# Patient Record
Sex: Female | Born: 1990
Health system: Southern US, Community
[De-identification: ages and names within clinical notes are randomized; demographics above are authoritative.]

## PROBLEM LIST (undated history)

## (undated) DIAGNOSIS — B9689 Other specified bacterial agents as the cause of diseases classified elsewhere: Secondary | ICD-10-CM

## (undated) DIAGNOSIS — Z9189 Other specified personal risk factors, not elsewhere classified: Secondary | ICD-10-CM

## (undated) DIAGNOSIS — K219 Gastro-esophageal reflux disease without esophagitis: Secondary | ICD-10-CM

## (undated) DIAGNOSIS — N760 Acute vaginitis: Secondary | ICD-10-CM

## (undated) DIAGNOSIS — Z803 Family history of malignant neoplasm of breast: Secondary | ICD-10-CM

## (undated) DIAGNOSIS — Z1371 Encounter for nonprocreative screening for genetic disease carrier status: Secondary | ICD-10-CM

## (undated) DIAGNOSIS — A549 Gonococcal infection, unspecified: Secondary | ICD-10-CM

## (undated) DIAGNOSIS — N390 Urinary tract infection, site not specified: Secondary | ICD-10-CM

## (undated) HISTORY — DX: Other specified personal risk factors, not elsewhere classified: Z91.89

## (undated) HISTORY — DX: Gonococcal infection, unspecified: A54.9

## (undated) HISTORY — DX: Family history of malignant neoplasm of breast: Z80.3

## (undated) HISTORY — DX: Other specified bacterial agents as the cause of diseases classified elsewhere: B96.89

## (undated) HISTORY — DX: Acute vaginitis: N76.0

## (undated) HISTORY — DX: Encounter for nonprocreative screening for genetic disease carrier status: Z13.71

---
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
## 2008-06-20 ENCOUNTER — Emergency Department: Payer: Self-pay | Admitting: Emergency Medicine

## 2008-06-28 ENCOUNTER — Emergency Department: Payer: Self-pay | Admitting: Emergency Medicine

## 2009-04-06 ENCOUNTER — Emergency Department: Payer: Self-pay | Admitting: Emergency Medicine

## 2012-01-30 DIAGNOSIS — M549 Dorsalgia, unspecified: Secondary | ICD-10-CM | POA: Insufficient documentation

## 2012-04-05 ENCOUNTER — Emergency Department: Payer: Self-pay | Admitting: Emergency Medicine

## 2013-11-05 ENCOUNTER — Ambulatory Visit: Payer: Self-pay | Admitting: Internal Medicine

## 2013-11-05 LAB — URINALYSIS, COMPLETE
BILIRUBIN, UR: NEGATIVE
Glucose,UR: NEGATIVE mg/dL (ref 0–75)
Ketone: NEGATIVE
NITRITE: NEGATIVE
Ph: 7 (ref 4.5–8.0)
SPECIFIC GRAVITY: 1.015 (ref 1.003–1.030)

## 2013-11-07 LAB — URINE CULTURE

## 2014-06-11 ENCOUNTER — Ambulatory Visit: Payer: Self-pay | Admitting: Physician Assistant

## 2014-10-14 ENCOUNTER — Ambulatory Visit
Admission: EM | Admit: 2014-10-14 | Discharge: 2014-10-14 | Disposition: A | Discharge status: Home / Self Care | Payer: 59 | Attending: Family Medicine | Admitting: Family Medicine

## 2014-10-14 ENCOUNTER — Encounter: Payer: Self-pay | Admitting: Emergency Medicine

## 2014-10-14 DIAGNOSIS — N3090 Cystitis, unspecified without hematuria: Secondary | ICD-10-CM | POA: Diagnosis present

## 2014-10-14 DIAGNOSIS — Z79899 Other long term (current) drug therapy: Secondary | ICD-10-CM | POA: Diagnosis not present

## 2014-10-14 DIAGNOSIS — L309 Dermatitis, unspecified: Secondary | ICD-10-CM | POA: Insufficient documentation

## 2014-10-14 DIAGNOSIS — N390 Urinary tract infection, site not specified: Secondary | ICD-10-CM | POA: Insufficient documentation

## 2014-10-14 HISTORY — DX: Urinary tract infection, site not specified: N39.0

## 2014-10-14 LAB — PREGNANCY, URINE: Preg Test, Ur: NEGATIVE

## 2014-10-14 LAB — URINALYSIS COMPLETE WITH MICROSCOPIC (ARMC ONLY)
BILIRUBIN URINE: NEGATIVE
Glucose, UA: NEGATIVE mg/dL
Ketones, ur: NEGATIVE mg/dL
NITRITE: NEGATIVE
Specific Gravity, Urine: 1.01 (ref 1.005–1.030)
pH: 5.5 (ref 5.0–8.0)

## 2014-10-14 MED ORDER — CIPROFLOXACIN HCL 500 MG PO TABS
500.0000 mg | ORAL_TABLET | Freq: Two times a day (BID) | ORAL | Status: DC
Start: 2014-10-14 — End: 2015-11-07

## 2014-10-14 MED ORDER — TRIAMCINOLONE ACETONIDE 0.1 % EX CREA: 1.0000 "application " | TOPICAL_CREAM | Freq: Two times a day (BID) | CUTANEOUS | Status: DC

## 2014-10-14 MED ORDER — PHENAZOPYRIDINE HCL 200 MG PO TABS: 200.0000 mg | ORAL_TABLET | Freq: Three times a day (TID) | ORAL | Status: DC

## 2014-10-14 MED ORDER — CARRINGTON MOISTURE BARRIER EX CREA: TOPICAL_CREAM | CUTANEOUS | Status: DC | PRN

## 2014-10-14 NOTE — ED Provider Notes (Signed)
CSN: [id number]     Arrival date & time 10/14/14  1103 History   First MD Initiated Contact with Patient 10/14/14 1017     Chief Complaint  Patient presents with  . Cystitis   (Consider location/radiation/quality/duration/timing/severity/associated sxs/prior Treatment) HPI Comments: Pt also c/o itchy chronic rash to bilateral arms, especially skin folds and near tattoos. Denies new lotions, perfumes, soaps, detergents, etc.   Patient is a 24 y.o. female presenting with dysuria. The history is provided by the patient. No language interpreter was used.  Dysuria Pain quality:  Burning Pain severity:  Moderate Onset quality:  Sudden Timing:  Constant Progression:  Unchanged Chronicity:  Recurrent Recent urinary tract infections: yes   Relieved by:  Nothing Worsened by:  Nothing tried Urinary symptoms: foul-smelling urine and hesitancy   Associated symptoms: abdominal pain   Associated symptoms: no fever, no flank pain, no genital lesions, no nausea, no vaginal discharge and no vomiting   Associated symptoms comment:  Denies vaginal discharge or new partner, no fever, no N/V.  Risk factors: not pregnant     Past Medical History  Diagnosis Date  . UTI (lower urinary tract infection)    History reviewed. No pertinent past surgical history. Family History  Problem Relation Age of Onset  . Schizophrenia Mother   . Autism Brother    History  Substance Use Topics  . Smoking status: Never Smoker   . Smokeless tobacco: Not on file  . Alcohol Use: Yes     Comment: socially   OB History    No data available     Review of Systems  Constitutional: Negative for fever, chills and appetite change.  HENT: Negative.   Eyes: Negative.   Respiratory: Negative for shortness of breath.   Cardiovascular: Negative for chest pain.  Gastrointestinal: Positive for abdominal pain. Negative for nausea, vomiting, diarrhea and constipation.  Endocrine: Negative.   Genitourinary: Positive for  dysuria. Negative for hematuria, flank pain, vaginal bleeding and vaginal discharge.  Musculoskeletal: Positive for back pain.  Skin: Negative for rash.  Allergic/Immunologic: Negative.   Neurological: Negative.   Hematological: Negative.   Psychiatric/Behavioral: Negative.   All other systems reviewed and are negative.   Allergies  Review of patient's allergies indicates no known allergies.  Home Medications   Prior to Admission medications   Medication Sig Start Date End Date Taking? Authorizing Provider  etonogestrel (NEXPLANON) 68 MG IMPL implant 1 each by Subdermal route once.   Yes Historical Provider, MD  ciprofloxacin (CIPRO) 500 MG tablet Take 1 tablet (500 mg total) by mouth 2 (two) times daily. 10/14/14   Clancy Gourd, NP  phenazopyridine (PYRIDIUM) 200 MG tablet Take 1 tablet (200 mg total) by mouth 3 (three) times daily. 10/14/14   Clancy Gourd, NP  Skin Protectants, Misc. (EUCERIN) cream Apply topically as needed for wound care. To moisturize 10/14/14   Clancy Gourd, NP  triamcinolone cream (KENALOG) 0.1 % Apply 1 application topically 2 (two) times daily. Sparingly to areas that itch 10/14/14   Jennetta Flood, NP   BP 110/66 mmHg  Pulse 72  Temp(Src) 98.5 F (36.9 C) (Oral)  Resp 16  Ht 5\' 3"  (1.6 m)  Wt 145 lb (65.772 kg)  BMI 25.69 kg/m2  SpO2 100%  LMP 09/27/2014 (Exact Date) Physical Exam  Constitutional: She is oriented to person, place, and time. Vital signs are normal. She appears well-developed and well-nourished. She is active and cooperative. No distress.  HENT:  Head: Normocephalic.  Eyes: Pupils are equal,  round, and reactive to light.  Neck: Normal range of motion.  Cardiovascular: Normal rate, regular rhythm, normal heart sounds and normal pulses.   Pulmonary/Chest: Effort normal and breath sounds normal.  Abdominal: Normal appearance and bowel sounds are normal. There is tenderness in the suprapubic area. There is no rebound, no  guarding and no CVA tenderness.  Musculoskeletal: Normal range of motion.  Neurological: She is alert and oriented to person, place, and time. No cranial nerve deficit or sensory deficit. GCS eye subscore is 4. GCS verbal subscore is 5. GCS motor subscore is 6.  Skin: Skin is warm and dry. Rash noted. Rash is macular.  Psychiatric: She has a normal mood and affect. Her speech is normal and behavior is normal.  Nursing note and vitals reviewed.   ED Course  Procedures (including critical care time) Labs Review Labs Reviewed  URINALYSIS COMPLETEWITH MICROSCOPIC ([facility] ONLY) - Abnormal; Notable for the following:    APPearance HAZY (*)    Hgb urine dipstick 2+ (*)    Protein, ur PRESENT (*)    Leukocytes, UA 3+ (*)    Squamous Epithelial / LPF 0-5 (*)    All other components within normal limits  URINE CULTURE  PREGNANCY, URINE   Results for orders placed or performed during the hospital encounter of 10/14/14  Urinalysis complete, with microscopic  Result Value Ref Range   Color, Urine YELLOW YELLOW   APPearance HAZY (A) CLEAR   Glucose, UA NEGATIVE NEGATIVE mg/dL   Bilirubin Urine NEGATIVE NEGATIVE   Ketones, ur NEGATIVE NEGATIVE mg/dL   Specific Gravity, Urine 1.010 1.005 - 1.030   Hgb urine dipstick 2+ (A) NEGATIVE   pH 5.5 5.0 - 8.0   Protein, ur PRESENT (A) NEGATIVE mg/dL   Nitrite NEGATIVE NEGATIVE   Leukocytes, UA 3+ (A) NEGATIVE   RBC / HPF 0-5 <3 RBC/hpf   WBC, UA TOO NUMEROUS TO COUNT <3 WBC/hpf   Bacteria, UA RARE RARE   Squamous Epithelial / LPF 0-5 (A) RARE   WBC Clumps PRESENT   Pregnancy, urine  Result Value Ref Range   Preg Test, Ur NEGATIVE NEGATIVE   Imaging Review No results found.   MDM   1. UTI (lower urinary tract infection)   2. Eczema    Discussed plan of care with pt: You are being treated for UTI and eczema. Moisture your skin. Recommend f/u with dermatology if rash persists or worsens. Avoid heat/hot water as it makes rashes worse.  Rest,push fluids, take meds as directed (Cipro/pyridium, triamcinolone cream/eucerin). Follow up with PCP, in 2 days for recheck. Also referral given to local Urologist, for further eval of recurrent UTI's. Return as needed. Go to Er if develops fever, NV,V, unable to take antibiotics. Pt verbalized understanding to this provider.   Clancy Gourd, NP 10/14/14 [phone]

## 2014-10-14 NOTE — Discharge Instructions (Signed)
You are being treated for UTI and eczema. Moisture your skin. Recommend f/u with dermatology if rash persists or worsens. Avoid heat/hot water as it makes rashes worse. Rest,push fluids, take meds as directed(Cipro/pyridium). Follow up with PCP, in 2 days for recheck. Also referral given to local Urologist, for further eval of recurrent UTI's. Return as needed. Go to Er if develops fever, NV,V, unable to take antibiotics.

## 2014-10-14 NOTE — ED Notes (Signed)
Started Monday morning with foul odor to urine, tingling/burning at the end of voiding.  C/o suprapubic and low back pain .  Denies fever

## 2014-10-16 LAB — URINE CULTURE

## 2015-03-17 ENCOUNTER — Encounter: Payer: Self-pay | Admitting: Emergency Medicine

## 2015-03-17 ENCOUNTER — Ambulatory Visit
Admission: EM | Admit: 2015-03-17 | Discharge: 2015-03-17 | Disposition: A | Discharge status: Home / Self Care | Payer: 59 | Attending: Family Medicine | Admitting: Family Medicine

## 2015-03-17 DIAGNOSIS — N390 Urinary tract infection, site not specified: Secondary | ICD-10-CM

## 2015-03-17 LAB — URINALYSIS COMPLETE WITH MICROSCOPIC (ARMC ONLY)
Bilirubin Urine: NEGATIVE
Glucose, UA: NEGATIVE mg/dL
Ketones, ur: NEGATIVE mg/dL
Nitrite: POSITIVE — AB
PROTEIN: NEGATIVE mg/dL
SPECIFIC GRAVITY, URINE: 1.01 (ref 1.005–1.030)
pH: 7 (ref 5.0–8.0)

## 2015-03-17 LAB — PREGNANCY, URINE: Preg Test, Ur: NEGATIVE

## 2015-03-17 MED ORDER — NITROFURANTOIN MONOHYD MACRO 100 MG PO CAPS: 100.0000 mg | ORAL_CAPSULE | Freq: Two times a day (BID) | ORAL | Status: DC

## 2015-03-17 NOTE — ED Notes (Signed)
Patient c/o burning when urinating since yesterday.  Patient denies N/V.  Patient denies fevers.

## 2015-03-17 NOTE — Discharge Instructions (Signed)
Take medication as prescribed. Follow up with your primary care physician as discussed. Return to Urgent care as needed for new or worsening concerns.   Urinary Tract Infection A urinary tract infection (UTI) can occur any place along the urinary tract. The tract includes the kidneys, ureters, bladder, and urethra. A type of germ called bacteria often causes a UTI. UTIs are often helped with antibiotic medicine.  HOME CARE   If given, take antibiotics as told by your doctor. Finish them even if you start to feel better.  Drink enough fluids to keep your pee (urine) clear or pale yellow.  Avoid tea, drinks with caffeine, and bubbly (carbonated) drinks.  Pee often. Avoid holding your pee in for a long time.  Pee before and after having sex (intercourse).  Wipe from front to back after you poop (bowel movement) if you are a woman. Use each tissue only once. GET HELP RIGHT AWAY IF:   You have back pain.  You have lower belly (abdominal) pain.  You have chills.  You feel sick to your stomach (nauseous).  You throw up (vomit).  Your burning or discomfort with peeing does not go away.  You have a fever.  Your symptoms are not better in 3 days. MAKE SURE YOU:   Understand these instructions.  Will watch your condition.  Will get help right away if you are not doing well or get worse.   This information is not intended to replace advice given to you by your health care provider. Make sure you discuss any questions you have with your health care provider.   Document Released: 09/27/2007 Document Revised: 05/01/2014 Document Reviewed: 11/09/2011 Elsevier Interactive Patient Education [employer]2016 Elsevier Inc.

## 2015-03-17 NOTE — ED Provider Notes (Signed)
[facility]  ____________________________________________  Time seen: Approximately 5:12 PM  I have reviewed the triage vital signs and the nursing notes.   HISTORY   Chief Complaint Dysuria  HPI Traci Flowers is a 24 y.o. female presents for the complaints of 1 day of burning with urination as well as frequent urination. Patient also reports that her urine smells stronger. Patient reports she has a history of urinary tract infections and this feels similar. States last urinary tract infection was approximate 4 months ago. Denies recent antibiotic use. Denies current pain. Reports occasional suprapubic pressure pain described as mild.  Denies recent changes in sexual partners. Denies vaginal complaints. Denies vaginal discharge, vaginal pain, vaginal odor. Denies back pain. Denies fever, nausea, vomiting, diarrhea. Reports continues to eat and drink well.  [facility] PCP: Edwyna ShellBurney  LMP: one week   Past Medical History  Diagnosis Date  . UTI (lower urinary tract infection)     There are no active problems to display for this patient.   History reviewed. No pertinent past surgical history.  Current Outpatient Rx  Name  Route  Sig  Dispense  Refill  .           . etonogestrel (NEXPLANON) 68 MG IMPL implant   Subdermal   1 each by Subdermal route once.         .           .           .             Allergies Review of patient's allergies indicates no known allergies.  Family History  Problem Relation Age of Onset  . Schizophrenia Mother   . Autism Brother     Social History Social History  Substance Use Topics  . Smoking status: Never Smoker   . Smokeless tobacco: None  . Alcohol Use: Yes     Comment: socially    Review of Systems Constitutional: No fever/chills Eyes: No visual changes. ENT: No sore throat. Cardiovascular: Denies chest pain. Respiratory: Denies shortness of breath. Gastrointestinal: No abdominal pain.  No nausea,  no vomiting.  No diarrhea.  No constipation. Genitourinary: positive for dysuria. Musculoskeletal: Negative for back pain. Skin: Negative for rash. Neurological: Negative for headaches, focal weakness or numbness.  10-point ROS otherwise negative.  ____________________________________________   PHYSICAL EXAM:  VITAL SIGNS: ED Triage Vitals  Enc Vitals Group     BP 03/17/15 1637 108/65 mmHg     Pulse Rate 03/17/15 1637 60     Resp 03/17/15 1637 16     Temp 03/17/15 1637 97.8 F (36.6 C)     Temp Source 03/17/15 1637 Tympanic     SpO2 03/17/15 1637 100 %     Weight 03/17/15 1637 148 lb (67.132 kg)     Height 03/17/15 1637 5\' 3"  (1.6 m)     Head Cir --      Peak Flow --      Pain Score --      Pain Loc --      Pain Edu? --      Excl. in GC? --     Constitutional: Alert and oriented. Well appearing and in no acute distress. Eyes: Conjunctivae are normal. PERRL. EOMI. Head: Atraumatic. Nontender.    Nose: No congestion/rhinnorhea.  Mouth/Throat: Mucous membranes are moist.  Oropharynx non-erythematous. Neck: No stridor.  No cervical spine tenderness to palpation. Hematological/Lymphatic/Immunilogical: No cervical lymphadenopathy. Cardiovascular: Normal rate, regular rhythm. Grossly  normal heart sounds.  Good peripheral circulation. Respiratory: Normal respiratory effort.  No retractions. Lungs CTAB. Gastrointestinal: Soft and nontender. No distention. Normal Bowel sounds.  No CVA tenderness. Musculoskeletal: No lower or upper extremity tenderness nor edema. Bilateral pedal pulses equal and easily palpated.  Neurologic:  Normal speech and language. No gross focal neurologic deficits are appreciated. No gait instability. Skin:  Skin is warm, dry and intact. No rash noted. Psychiatric: Mood and affect are normal. Speech and behavior are normal .  ____________________________________________   LABS (all labs ordered are listed, but only abnormal results are  displayed)  Labs Reviewed  URINALYSIS COMPLETEWITH MICROSCOPIC ([facility] ONLY) - Abnormal; Notable for the following:    APPearance HAZY (*)    Hgb urine dipstick 2+ (*)    Nitrite POSITIVE (*)    Leukocytes, UA 3+ (*)    Bacteria, UA MANY (*)    Squamous Epithelial / LPF 0-5 (*)    All other components within normal limits  URINE CULTURE  PREGNANCY, URINE     INITIAL IMPRESSION / ASSESSMENT AND PLAN / ED COURSE  Pertinent labs & imaging results that were available during my care of the patient were reviewed by me and considered in my medical decision making (see chart for details).   Very well-appearing patient. No acute distress. Presents for the complaints of 1 day of dysuria. Will treat your tract infection with oral Macrobid. Patient follow-up with primary care physician as needed. Encouraged to void post sexual intercourse, increased water intake and follow-up as needed. States she does not need pyridium.   Discussed follow up with Primary care physician this week. Discussed follow up and return parameters including no resolution or any worsening concerns. Patient verbalized understanding and agreed to plan.   ____________________________________________   FINAL CLINICAL IMPRESSION(S) / ED DIAGNOSES  Final diagnoses:  UTI (lower urinary tract infection)       Renford Dills, NP 03/17/15 1735

## 2015-03-20 LAB — URINE CULTURE
Culture: >=100000
SPECIAL REQUESTS: NORMAL

## 2015-04-22 ENCOUNTER — Encounter: Payer: Self-pay | Admitting: *Deleted

## 2015-04-22 ENCOUNTER — Ambulatory Visit
Admission: EM | Admit: 2015-04-22 | Discharge: 2015-04-22 | Disposition: A | Discharge status: Home / Self Care | Payer: 59 | Attending: Family Medicine | Admitting: Family Medicine

## 2015-04-22 DIAGNOSIS — M436 Torticollis: Secondary | ICD-10-CM

## 2015-04-22 MED ORDER — KETOROLAC TROMETHAMINE 60 MG/2ML IM SOLN
60.0000 mg | Freq: Once | INTRAMUSCULAR | Status: AC
  Administered 2015-04-22: 60 mg via INTRAMUSCULAR

## 2015-04-22 MED ORDER — CYCLOBENZAPRINE HCL 10 MG PO TABS: 10.0000 mg | ORAL_TABLET | Freq: Three times a day (TID) | ORAL | Status: DC | PRN

## 2015-04-22 NOTE — ED Provider Notes (Signed)
[facility]  ____________________________________________  Time seen: Approximately 11:19 AM  I have reviewed the triage vital signs and the nursing notes.   HISTORY  Chief Complaint Neck Pain   HPI  Traci Flowers is a 24 y.o. female presents with complaints of right neck pain. 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 Patient states that "I think I slept wrong". Denies fall or direct trauma. Patient states that pain is primarily with movement and primarily with looking towards her right. Denies chronic neck pain.  States current neck pain is 1 out of 10 but states when looking to the right pain is 6 out of 10 and catching. Patient states the pain is tolerable however states as she was at work last night and having to move and reaching grab things her pain was intermittently increasing. Denies pain radiation. Denies numbness or tingling sensation. Denies midline pain. Denies other pain or injuries. Reports took some over-the-counter ibuprofen which helped some.   Past Medical History  Diagnosis Date  . UTI (lower urinary tract infection)     There are no active problems to display for this patient.   History reviewed. No pertinent past surgical history.  Current Outpatient Rx  Name  Route  Sig  Dispense  Refill  . etonogestrel (NEXPLANON) 68 MG IMPL implant   Subdermal   1 each by Subdermal route once.         .           .           .           .           .           .           Last menstrual 2 weeks ago.: Denies chance of pregnancy  Allergies Review of patient's allergies indicates no known allergies.  Family History  Problem Relation Age of Onset  . Schizophrenia Mother   . Autism Brother     Social History Social History  Substance Use Topics  . Smoking status: Never Smoker   . Smokeless tobacco: None  . Alcohol Use: Yes     Comment: socially     Review of Systems Constitutional: No fever/chills Eyes: No visual changes. ENT: No sore throat. Cardiovascular: Denies chest pain. Respiratory: Denies shortness of breath. Gastrointestinal: No abdominal pain.  No nausea, no vomiting.  No diarrhea.  No constipation. Genitourinary: Negative for dysuria. Musculoskeletal: Negative for back pain. Right neck pain. Skin: Negative for rash. Neurological: Negative for headaches, focal weakness or numbness.  10-point ROS otherwise negative.  ____________________________________________   PHYSICAL EXAM:  VITAL SIGNS: ED Triage Vitals  Enc Vitals Group     BP 04/22/15 1030 117/47 mmHg     Pulse Rate 04/22/15 1030 64     Resp 04/22/15 1030 20     Temp 04/22/15 1030 97.6 F (36.4 C)     Temp Source 04/22/15 1030 Oral     SpO2 04/22/15 1030 100 %     Weight 04/22/15 1030 150 lb (68.04 kg)     Height 04/22/15 1030  (1.6 m)     Head Cir --      Peak Flow --      Pain Score 04/22/15 1034 7     Pain Loc --  Pain Edu? --      Excl. in GC? --     Constitutional: Alert and oriented. Well appearing and in no acute distress. Eyes: Conjunctivae are normal. PERRL. EOMI. Head: Atraumatic.  Ears: no erythema, normal TMs bilaterally.   Nose: No congestion/rhinnorhea.  Mouth/Throat: Mucous membranes are moist.  Oropharynx non-erythematous. Neck: No stridor.  No cervical spine tenderness to palpation. Hematological/Lymphatic/Immunilogical: No cervical lymphadenopathy. Cardiovascular: Normal rate, regular rhythm. Grossly normal heart sounds.  Good peripheral circulation. Respiratory: Normal respiratory effort.  No retractions. Lungs CTAB. Gastrointestinal: Soft and nontender.  Musculoskeletal: No lower or upper extremity tenderness nor edema. No midline cervical, thoracic or lumbar tenderness to palpation. Patient with point tenderness to right trapezius, with palpable muscle spasm with right flexion. No pain with cervical flexion  or extension. Mild pain with left cervical rotation, mild to moderate pain with right cervical rotation, again no midline cervical tenderness. No swelling. No erythema. Bilateral upper x-rays nontender with 5 out of 5 strength. Bilateral hand grips equal, sensation intact bilateral upper extremities, bilateral distal radial pulses equal.  Neurologic:  Normal speech and language. No gross focal neurologic deficits are appreciated. No gait instability. Skin:  Skin is warm, dry and intact. No rash noted. Psychiatric: Mood and affect are normal. Speech and behavior are normal.  ____________________________________________   LABS (all labs ordered are listed, but only abnormal results are displayed)  Labs Reviewed - No data to display   INITIAL IMPRESSION / ASSESSMENT AND PLAN / ED COURSE  Pertinent labs & imaging results that were available during my care of the patient were reviewed by me and considered in my medical decision making (see chart for details).  Very well-appearing patient. No acute distress. Presents for the complaint of right neck pain since last night after waking up from sleep. Denies fall or trauma. No midline tenderness. Point reproducible pain at right trapezius with palpable muscle spasm. Suspect muscular strain. Will treat with IM Toradol 60 mg 1 in urgent care. Will also treat supportively with alternate between heat and ice, stretch, rest. Patient states that she will take over-the-counter ibuprofen and also will treat when necessary with Flexeril.  Discussed follow up with Primary care physician this week. Discussed follow up and return parameters including no resolution or any worsening concerns. Patient verbalized understanding and agreed to plan.   ____________________________________________   FINAL CLINICAL IMPRESSION(S) / ED DIAGNOSES  Final diagnoses:  Right torticollis       Renford DillsLindsey Jerald Hennington, NP 04/22/15 1126

## 2015-04-22 NOTE — Discharge Instructions (Signed)
Take medication as prescribed. Alternate heat and ice. Stretch.   Follow up with your primary care physician this week as needed. Return to Urgent care for new or worsening concerns.   Acute Torticollis Torticollis is a condition in which the muscles of the neck tighten (contract) abnormally, causing the neck to twist and the head to move into an unnatural position. Torticollis that develops suddenly is called acute torticollis. If torticollis becomes chronic and is left untreated, the face and neck can become deformed. CAUSES This condition may be caused by:  Sleeping in an awkward position (common).  Extending or twisting the neck muscles beyond their normal position.  Infection. In some cases, the cause may not be known. SYMPTOMS Symptoms of this condition include:  An unnatural position of the head.  Neck pain.  A limited ability to move the neck.  Twisting of the neck to one side. DIAGNOSIS This condition is diagnosed with a physical exam. You may also have imaging tests, such as an X-ray, CT scan, or MRI. TREATMENT Treatment for this condition involves trying to relax the neck muscles. It may include:  Medicines or shots.  Physical therapy.  Surgery. This may be done in severe cases. HOME CARE INSTRUCTIONS  Take medicines only as directed by your health care provider.  Do stretching exercises and massage your neck as directed by your health care provider.  Keep all follow-up visits as directed by your health care provider. This is important. SEEK MEDICAL CARE IF:  You develop a fever. SEEK IMMEDIATE MEDICAL CARE IF:  You develop difficulty breathing.  You develop noisy breathing (stridor).  You start drooling.  You have trouble swallowing or have pain with swallowing.  You develop numbness or weakness in your hands or feet.  You have changes in your speech, understanding, or vision.  Your pain gets worse.   This information is not intended to replace  advice given to you by your health care provider. Make sure you discuss any questions you have with your health care provider.   Document Released: 04/07/2000 Document Revised: 08/25/2014 Document Reviewed: 04/06/2014 Elsevier Interactive Patient Education [employer]2016 [employer] Inc.

## 2015-04-22 NOTE — ED Notes (Signed)
Patient sleep on her neck wrong last PM and is having right side neck pain.

## 2015-06-22 ENCOUNTER — Ambulatory Visit: Admission: EM | Admit: 2015-06-22 | Discharge: 2015-06-22 | Discharge status: Absent without leave | Payer: 59

## 2015-06-23 DIAGNOSIS — H6983 Other specified disorders of Eustachian tube, bilateral: Secondary | ICD-10-CM | POA: Diagnosis not present

## 2015-07-30 DIAGNOSIS — G8929 Other chronic pain: Secondary | ICD-10-CM | POA: Diagnosis not present

## 2015-07-30 DIAGNOSIS — M546 Pain in thoracic spine: Secondary | ICD-10-CM | POA: Diagnosis not present

## 2015-08-09 DIAGNOSIS — M6281 Muscle weakness (generalized): Secondary | ICD-10-CM | POA: Diagnosis not present

## 2015-08-09 DIAGNOSIS — M546 Pain in thoracic spine: Secondary | ICD-10-CM | POA: Diagnosis not present

## 2015-08-09 DIAGNOSIS — G8929 Other chronic pain: Secondary | ICD-10-CM | POA: Diagnosis not present

## 2015-08-20 DIAGNOSIS — M6281 Muscle weakness (generalized): Secondary | ICD-10-CM | POA: Diagnosis not present

## 2015-08-20 DIAGNOSIS — M546 Pain in thoracic spine: Secondary | ICD-10-CM | POA: Diagnosis not present

## 2015-08-20 DIAGNOSIS — G8929 Other chronic pain: Secondary | ICD-10-CM | POA: Diagnosis not present

## 2015-08-26 DIAGNOSIS — G8929 Other chronic pain: Secondary | ICD-10-CM | POA: Diagnosis not present

## 2015-08-26 DIAGNOSIS — M6281 Muscle weakness (generalized): Secondary | ICD-10-CM | POA: Diagnosis not present

## 2015-08-26 DIAGNOSIS — M546 Pain in thoracic spine: Secondary | ICD-10-CM | POA: Diagnosis not present

## 2015-09-02 DIAGNOSIS — M6281 Muscle weakness (generalized): Secondary | ICD-10-CM | POA: Diagnosis not present

## 2015-09-02 DIAGNOSIS — M546 Pain in thoracic spine: Secondary | ICD-10-CM | POA: Diagnosis not present

## 2015-09-02 DIAGNOSIS — G8929 Other chronic pain: Secondary | ICD-10-CM | POA: Diagnosis not present

## 2015-09-09 DIAGNOSIS — M6281 Muscle weakness (generalized): Secondary | ICD-10-CM | POA: Diagnosis not present

## 2015-09-09 DIAGNOSIS — G8929 Other chronic pain: Secondary | ICD-10-CM | POA: Diagnosis not present

## 2015-09-09 DIAGNOSIS — M546 Pain in thoracic spine: Secondary | ICD-10-CM | POA: Diagnosis not present

## 2015-09-15 DIAGNOSIS — M546 Pain in thoracic spine: Secondary | ICD-10-CM | POA: Diagnosis not present

## 2015-09-15 DIAGNOSIS — M6281 Muscle weakness (generalized): Secondary | ICD-10-CM | POA: Diagnosis not present

## 2015-09-15 DIAGNOSIS — G8929 Other chronic pain: Secondary | ICD-10-CM | POA: Diagnosis not present

## 2015-09-16 DIAGNOSIS — G8929 Other chronic pain: Secondary | ICD-10-CM | POA: Diagnosis not present

## 2015-09-16 DIAGNOSIS — M546 Pain in thoracic spine: Secondary | ICD-10-CM | POA: Diagnosis not present

## 2015-11-07 ENCOUNTER — Ambulatory Visit
Admission: EM | Admit: 2015-11-07 | Discharge: 2015-11-07 | Disposition: A | Discharge status: Home / Self Care | Payer: 59 | Attending: Family Medicine | Admitting: Family Medicine

## 2015-11-07 ENCOUNTER — Encounter: Payer: Self-pay | Admitting: Emergency Medicine

## 2015-11-07 DIAGNOSIS — N390 Urinary tract infection, site not specified: Secondary | ICD-10-CM | POA: Diagnosis not present

## 2015-11-07 DIAGNOSIS — R809 Proteinuria, unspecified: Secondary | ICD-10-CM | POA: Diagnosis not present

## 2015-11-07 LAB — URINALYSIS COMPLETE WITH MICROSCOPIC (ARMC ONLY)
Bilirubin Urine: NEGATIVE
Glucose, UA: NEGATIVE mg/dL
KETONES UR: NEGATIVE mg/dL
Nitrite: NEGATIVE
PH: 5.5 (ref 5.0–8.0)
Protein, ur: 30 mg/dL — AB
Specific Gravity, Urine: 1.025 (ref 1.005–1.030)

## 2015-11-07 MED ORDER — SULFAMETHOXAZOLE-TRIMETHOPRIM 800-160 MG PO TABS: 1.0000 | ORAL_TABLET | Freq: Two times a day (BID) | ORAL | Status: DC

## 2015-11-07 NOTE — Discharge Instructions (Signed)
Proteinuria Proteinuria is a condition in which urine contains more protein than is normal. Proteinuria is either a sign that your body is producing too much protein or a sign that there is a problem with the kidneys. Healthy kidneys prevent most substances that the body needs, including proteins, from leaving the bloodstream and ending up in urine. CAUSES  Proteinuria may be caused by a temporary event or condition such as stress, exercise, or fever, and go away on its own. Proteinuria may also be a symptom of a more serious condition or disease. Causes of proteinuria include:  A kidney disease caused by:  Diabetes.  High blood pressure (hypertension).   A disease that affects the immune system, such as lupus.  A genetic disease, such as Alport's syndrome.  Medicines that damage the kidneys, such as long-term nonsteroidal anti-inflammatory drugs (NSAIDs).  Poisoning or exposure to toxic substances.  A reoccurring kidney or urinary infection.  Excess protein production in the body caused by:  Multiple myeloma.  Amyloidosis. SYMPTOMS You may have proteinuria without having noticeable symptoms. If there is a large amount of protein in your urine, your urine may look foamy. You may also notice swelling (edema) in your hands, feet, abdomen, or face. DIAGNOSIS To determine whether you have proteinuria, you will need to provide a urine sample. Your urine will then be tested for too much protein and the main blood protein albumin. If your test shows that you have proteinuria, you may need to take additional tests to determine its cause, how much protein is in your urine, and what type of protein is being lost. Tests may include:  Blood tests.  Urine tests.  A blood pressure measurement.  Imaging tests. TREATMENT  Treatment will depend on the cause of your proteinuria. Your caregiver will discuss treatment options with you after you have been diagnosed. If your proteinuria is mild or  temporary, no treatment may be necessary. HOME CARE INSTRUCTIONS Ask your caregiver if monitoring the level of protein in your urine at home using simple testing strips is appropriate for you. Early detection of proteinuria can lead to early and often successful treatment of the condition causing it.   This information is not intended to replace advice given to you by your health care provider. Make sure you discuss any questions you have with your health care provider.   Document Released: 05/31/2005 Document Revised: 01/03/2012 Document Reviewed: 09/08/2011 Elsevier Interactive Patient Education 2016 [employer] Urinary Tract Infection Urinary tract infections (UTIs) can develop anywhere along your urinary tract. Your urinary tract is your body's drainage system for removing wastes and extra water. Your urinary tract includes two kidneys, two ureters, a bladder, and a urethra. Your kidneys are a pair of bean-shaped organs. Each kidney is about the size of your fist. They are located below your ribs, one on each side of your spine. CAUSES Infections are caused by microbes, which are microscopic organisms, including fungi, viruses, and bacteria. These organisms are so small that they can only be seen through a microscope. Bacteria are the microbes that most commonly cause UTIs. SYMPTOMS  Symptoms of UTIs may vary by age and gender of the patient and by the location of the infection. Symptoms in young women typically include a frequent and intense urge to urinate and a painful, burning feeling in the bladder or urethra during urination. Older women and men are more likely to be tired, shaky, and weak and have muscle aches and abdominal pain. A fever may mean  the infection is in your kidneys. Other symptoms of a kidney infection include pain in your back or sides below the ribs, nausea, and vomiting. DIAGNOSIS To diagnose a UTI, your caregiver will ask you about your symptoms. Your caregiver will  also ask you to provide a urine sample. The urine sample will be tested for bacteria and white blood cells. White blood cells are made by your body to help fight infection. TREATMENT  Typically, UTIs can be treated with medication. Because most UTIs are caused by a bacterial infection, they usually can be treated with the use of antibiotics. The choice of antibiotic and length of treatment depend on your symptoms and the type of bacteria causing your infection. HOME CARE INSTRUCTIONS  If you were prescribed antibiotics, take them exactly as your caregiver instructs you. Finish the medication even if you feel better after you have only taken some of the medication.  Drink enough water and fluids to keep your urine clear or pale yellow.  Avoid caffeine, tea, and carbonated beverages. They tend to irritate your bladder.  Empty your bladder often. Avoid holding urine for long periods of time.  Empty your bladder before and after sexual intercourse.  After a bowel movement, women should cleanse from front to back. Use each tissue only once. SEEK MEDICAL CARE IF:   You have back pain.  You develop a fever.  Your symptoms do not begin to resolve within 3 days. SEEK IMMEDIATE MEDICAL CARE IF:   You have severe back pain or lower abdominal pain.  You develop chills.  You have nausea or vomiting.  You have continued burning or discomfort with urination. MAKE SURE YOU:   Understand these instructions.  Will watch your condition.  Will get help right away if you are not doing well or get worse.   This information is not intended to replace advice given to you by your health care provider. Make sure you discuss any questions you have with your health care provider.   Document Released: 01/18/2005 Document Revised: 12/30/2014 Document Reviewed: 05/19/2011 Elsevier Interactive Patient Education [employer].

## 2015-11-07 NOTE — ED Provider Notes (Signed)
CSN: [id number]     Arrival date & [id number]  7829 History   First MD Initiated Contact with Patient 11/07/15 [phone]     Chief Complaint  Patient presents with  . Urinary Tract Infection   (Consider location/radiation/quality/duration/timing/severity/associated sxs/prior Treatment) HPI Comments: Single african [place] female here for dysuria since 14 Jul.  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   Slight abdomen pain and nausea along with odor and change in color using azo and cranberry juice with mild relief but no resolution  Has used macrobid and cipro in the past with good results  Patient is a 25 y.o. female presenting with urinary tract infection. The history is provided by the patient.  Urinary Tract Infection Pain quality:  Burning Pain severity:  Mild Onset quality:  Sudden Duration:  2 days Timing:  Intermittent Progression:  Unchanged Chronicity:  Recurrent Recent urinary tract infections: yes   Relieved by:  Cranberry juice and phenazopyridine Worsened by:  Nothing tried Ineffective treatments:  Cranberry juice and phenazopyridine Urinary symptoms: discolored urine, foul-smelling urine and frequent urination   Urinary symptoms: no hematuria, no hesitancy and no bladder incontinence   Associated symptoms: abdominal pain and nausea   Associated symptoms: no fever, no flank pain, no genital lesions, no vaginal discharge and no vomiting     Past Medical History  Diagnosis Date  . UTI (lower urinary tract infection)    History reviewed. No pertinent past surgical history. Family History  Problem Relation Age of Onset  . Schizophrenia Mother   . Autism Brother    Social History  Substance Use Topics  . Smoking status: Never Smoker   . Smokeless tobacco: None  . Alcohol Use: Yes     Comment: socially   OB History    No data available     Review of Systems  Constitutional: Negative for fever and chills.  HENT: Negative for  congestion, ear pain and sore throat.   Eyes: Negative for pain and discharge.  Respiratory: Negative for cough and wheezing.   Cardiovascular: Negative for chest pain and leg swelling.  Gastrointestinal: Positive for nausea and abdominal pain. Negative for vomiting, diarrhea, constipation, blood in stool and abdominal distention.  Genitourinary: Positive for dysuria, urgency, frequency, vaginal bleeding and pelvic pain. Negative for hematuria, flank pain, decreased urine volume, vaginal discharge, enuresis, difficulty urinating, genital sores, vaginal pain and menstrual problem.  Musculoskeletal: Negative for myalgias, back pain and arthralgias.  Skin: Negative for rash.  Allergic/Immunologic: Negative for environmental allergies and food allergies.  Neurological: Negative for headaches.  Hematological: Negative for adenopathy. Does not bruise/bleed easily.  Psychiatric/Behavioral: Negative for confusion, sleep disturbance and agitation. The patient is not nervous/anxious.     Allergies  Review of patient's allergies indicates no known allergies.  Home Medications   Prior to Admission medications   Medication Sig Start Date End Date Taking? Authorizing Provider  etonogestrel (NEXPLANON) 68 MG IMPL implant 1 each by Subdermal route once.    Historical Provider, MD  phenazopyridine (PYRIDIUM) 200 MG tablet Take 1 tablet (200 mg total) by mouth 3 (three) times daily. 10/14/14   Clancy Gourd, NP  Skin Protectants, Misc. (EUCERIN) cream Apply topically as needed for wound care. To moisturize 10/14/14   Clancy Gourd, NP  sulfamethoxazole-trimethoprim (BACTRIM DS,SEPTRA DS) 800-160 MG tablet Take 1 tablet by mouth 2 (two) times daily. 11/07/15   Barbaraann Barthel, NP  triamcinolone cream (KENALOG) 0.1 % Apply 1 application topically 2 (  two) times daily. Sparingly to areas that itch 10/14/14   Clancy GourdJeanette Defelice, NP   Meds Ordered and Administered this Visit  Medications - No data to  display  BP 115/79 mmHg  Pulse 70  Temp(Src) 98.1 F (36.7 C) (Oral)  Resp 16  Ht 5\' 3"  (1.6 m)  Wt 151 lb (68.493 kg)  BMI 26.76 kg/m2  SpO2 100%  LMP 11/05/2015 (Exact Date) No data found.   Physical Exam  Constitutional: She is oriented to person, place, and time. Vital signs are normal. She appears well-developed and well-nourished. She is active and cooperative.  Non-toxic appearance. She does not have a sickly appearance. She does not appear ill. No distress.  HENT:  Head: Normocephalic and atraumatic.  Right Ear: Hearing and external ear normal.  Left Ear: Hearing and external ear normal.  Nose: No mucosal edema, rhinorrhea, nose lacerations, sinus tenderness, nasal deformity, septal deviation or nasal septal hematoma. No epistaxis.  No foreign bodies.  Mouth/Throat: Uvula is midline and mucous membranes are normal. Mucous membranes are not pale, not dry and not cyanotic. She does not have dentures. No oral lesions. No trismus in the jaw. Normal dentition. No dental abscesses, uvula swelling, lacerations or dental caries. No oropharyngeal exudate, posterior oropharyngeal edema, posterior oropharyngeal erythema or tonsillar abscesses.  Eyes: Conjunctivae, EOM and lids are normal. Pupils are equal, round, and reactive to light. Right eye exhibits no chemosis, no discharge, no exudate and no hordeolum. No foreign body present in the right eye. Left eye exhibits no chemosis, no discharge, no exudate and no hordeolum. No foreign body present in the left eye. Right conjunctiva is not injected. Right conjunctiva has no hemorrhage. Left conjunctiva is not injected. Left conjunctiva has no hemorrhage. No scleral icterus. Right eye exhibits normal extraocular motion and no nystagmus. Left eye exhibits normal extraocular motion and no nystagmus. Right pupil is round and reactive. Left pupil is round and reactive. Pupils are equal.  Neck: Trachea normal and normal range of motion. Neck supple. No  tracheal tenderness, no spinous process tenderness and no muscular tenderness present. No rigidity. No tracheal deviation, no edema, no erythema and normal range of motion present. No thyroid mass and no thyromegaly present.  Cardiovascular: Normal rate, regular rhythm, S1 normal, S2 normal, normal heart sounds and intact distal pulses.  PMI is not displaced.  Exam reveals no gallop and no friction rub.   No murmur heard. Pulmonary/Chest: Effort normal and breath sounds normal. No accessory muscle usage or stridor. No respiratory distress. She has no decreased breath sounds. She has no wheezes. She has no rhonchi. She has no rales. She exhibits no tenderness.  Abdominal: Soft. Normal appearance and bowel sounds are normal. She exhibits no shifting dullness, no distension, no pulsatile liver, no fluid wave, no abdominal bruit, no ascites, no pulsatile midline mass and no mass. There is no hepatosplenomegaly. There is tenderness in the left upper quadrant. There is no rigidity, no rebound, no guarding, no CVA tenderness, no tenderness at McBurney's point and negative Murphy's sign. Hernia confirmed negative in the ventral area.  Normoactive bowel sounds x 4 quads;  Dull to percussion x 4 quads  Musculoskeletal: Normal range of motion. She exhibits no edema or tenderness.       Right shoulder: Normal.       Left shoulder: Normal.       Right hip: Normal.       Left hip: Normal.       Right knee: Normal.  Left knee: Normal.       Cervical back: Normal.       Right hand: Normal.       Left hand: Normal.  Lymphadenopathy:       Head (right side): No submental, no submandibular, no tonsillar, no preauricular, no posterior auricular and no occipital adenopathy present.       Head (left side): No submental, no submandibular, no tonsillar, no preauricular, no posterior auricular and no occipital adenopathy present.    She has no cervical adenopathy.       Right cervical: No superficial cervical, no  deep cervical and no posterior cervical adenopathy present.      Left cervical: No superficial cervical, no deep cervical and no posterior cervical adenopathy present.  Neurological: She is alert and oriented to person, place, and time. She has normal strength. She is not disoriented. She displays no atrophy and no tremor. No cranial nerve deficit or sensory deficit. She exhibits normal muscle tone. She displays no seizure activity. Coordination and gait normal. GCS eye subscore is 4. GCS verbal subscore is 5. GCS motor subscore is 6.  Skin: Skin is warm, dry and intact. No abrasion, no bruising, no burn, no ecchymosis, no laceration, no lesion, no petechiae and no rash noted. She is not diaphoretic. No cyanosis or erythema. No pallor. Nails show no clubbing.  Psychiatric: She has a normal mood and affect. Her speech is normal and behavior is normal. Judgment and thought content normal. Cognition and memory are normal.  Nursing note and vitals reviewed.   ED Course  Procedures (including critical care time)  Labs Review Labs Reviewed  URINALYSIS COMPLETEWITH MICROSCOPIC ([facility] ONLY) - Abnormal; Notable for the following:    APPearance CLOUDY (*)    Hgb urine dipstick MODERATE (*)    Protein, ur 30 (*)    Leukocytes, UA MODERATE (*)    Bacteria, UA MANY (*)    Squamous Epithelial / LPF 0-5 (*)    All other components within normal limits  URINE CULTURE    Imaging Review No results found.   1000 reviewed result with patient given copy previous urine culture and urinalysis results on file urine culture pending typically 48 hours will call with results once available. Patient verbalized understanding information/instructions agreed plan of care and had no further questions at this time.    MDM   1. UTI (lower urinary tract infection)   2. Proteinuria    Medications as directed.  Bactrim ds po BID x 7 days.  Previous urine cultures with macrobid and amoxicillin resistence.  Consider  urology evaluation as 3rd UTI this year.  Refused STD testing today new partner consider if vaginal symptoms/recurrent continues despite urinating and showering after sex.  Due to 10 days spotting monthly with nexplanon pantiliners may also be contributing.  Follow up with PCM 2-4 weeks repeat urinalysis due to proteinuria and family history kidney disease.   Patient is also to push fluids and may use OTC azo po prn per manufacturers instructions  Hydrate, avoid dehydration.  Avoid holding urine void on frequent basis every 4 to 6 hours.  If unable to void every 8 hours follow up for re-evaluation with PCM, urgent care or ER.   Call or return to clinic as needed if these symptoms worsen or fail to improve as anticipated.  Exitcare handout on UTI and proteinuria given to patient Patient verbalized agreement and understanding of treatment plan and had no further questions at this time. P2:  Hydrate and cranberry juice    Barbaraann Barthel, NP 11/07/15 1016

## 2015-11-07 NOTE — ED Notes (Signed)
Pt reports urinary symptoms started Friday night with frequency, pain lower pelvis, bad odor and some nausea.

## 2015-11-10 LAB — URINE CULTURE
Culture: >=100000 — AB
Special Requests: NORMAL

## 2015-11-11 ENCOUNTER — Telehealth: Payer: Self-pay | Admitting: Family Medicine

## 2015-11-11 NOTE — Telephone Encounter (Signed)
Patient notified via telephone urine culture e coli no reistance to antibiotics noted.  Patient reported she is feeling better.  [facility] as previously discussed to ensure proteinuria resolved.  Patient was on menses when sample provided + blood in sample also.  Patient verbalized understanding info/instructions agreed plan of care and no further questions at this time.  Specimen Description URINE, CLEAN CATCH   Special Requests Normal   Culture >=100,000 COLONIES/mL ESCHERICHIA COLI (A)   Report Status 11/10/2015 FINAL   Organism ID, Bacteria ESCHERICHIA COLI (A)   Resulting Agency [facility]    Culture & Susceptibility      ESCHERICHIA COLI     Antibiotic Sensitivity Microscan Status    AMPICILLIN Sensitive <=2 SENSITIVE Final    Method: MIC    AMPICILLIN/SULBACTAM Sensitive <=2 SENSITIVE Final    Method: MIC    CEFAZOLIN Sensitive <=4 SENSITIVE Final    Method: MIC    CEFTRIAXONE Sensitive <=1 SENSITIVE Final    Method: MIC    CIPROFLOXACIN Sensitive <=0.25 SENSITIVE Final    Method: MIC    Extended ESBL Sensitive NEGATIVE Final    Method: MIC    GENTAMICIN Sensitive <=1 SENSITIVE Final    Method: MIC    IMIPENEM Sensitive <=0.25 SENSITIVE Final    Method: MIC    NITROFURANTOIN Sensitive <=16 SENSITIVE Final    Method: MIC    PIP/TAZO Sensitive <=4 SENSITIVE Final    Method: MIC    TRIMETH/SULFA Sensitive <=20 SENSITIVE Final

## 2015-11-16 DIAGNOSIS — N62 Hypertrophy of breast: Secondary | ICD-10-CM | POA: Diagnosis not present

## 2015-11-23 DIAGNOSIS — Z113 Encounter for screening for infections with a predominantly sexual mode of transmission: Secondary | ICD-10-CM | POA: Diagnosis not present

## 2015-11-23 DIAGNOSIS — N898 Other specified noninflammatory disorders of vagina: Secondary | ICD-10-CM | POA: Diagnosis not present

## 2015-11-23 DIAGNOSIS — N760 Acute vaginitis: Secondary | ICD-10-CM | POA: Diagnosis not present

## 2015-11-23 DIAGNOSIS — B9689 Other specified bacterial agents as the cause of diseases classified elsewhere: Secondary | ICD-10-CM | POA: Diagnosis not present

## 2016-01-31 DIAGNOSIS — N62 Hypertrophy of breast: Secondary | ICD-10-CM | POA: Insufficient documentation

## 2016-02-07 DIAGNOSIS — Z113 Encounter for screening for infections with a predominantly sexual mode of transmission: Secondary | ICD-10-CM | POA: Diagnosis not present

## 2016-02-07 DIAGNOSIS — Z124 Encounter for screening for malignant neoplasm of cervix: Secondary | ICD-10-CM | POA: Diagnosis not present

## 2016-02-07 DIAGNOSIS — Z3049 Encounter for surveillance of other contraceptives: Secondary | ICD-10-CM | POA: Diagnosis not present

## 2016-02-07 DIAGNOSIS — N760 Acute vaginitis: Secondary | ICD-10-CM | POA: Diagnosis not present

## 2016-02-07 DIAGNOSIS — Z01419 Encounter for gynecological examination (general) (routine) without abnormal findings: Secondary | ICD-10-CM | POA: Diagnosis not present

## 2016-02-07 DIAGNOSIS — Z30016 Encounter for initial prescription of transdermal patch hormonal contraceptive device: Secondary | ICD-10-CM | POA: Diagnosis not present

## 2016-02-21 ENCOUNTER — Encounter: Payer: Self-pay | Admitting: Emergency Medicine

## 2016-02-21 ENCOUNTER — Ambulatory Visit
Admission: EM | Admit: 2016-02-21 | Discharge: 2016-02-21 | Disposition: A | Discharge status: Home / Self Care | Payer: 59 | Attending: Family Medicine | Admitting: Family Medicine

## 2016-02-21 DIAGNOSIS — R319 Hematuria, unspecified: Secondary | ICD-10-CM

## 2016-02-21 DIAGNOSIS — N390 Urinary tract infection, site not specified: Secondary | ICD-10-CM

## 2016-02-21 LAB — URINALYSIS COMPLETE WITH MICROSCOPIC (ARMC ONLY)
BILIRUBIN URINE: NEGATIVE
GLUCOSE, UA: NEGATIVE mg/dL
Ketones, ur: NEGATIVE mg/dL
NITRITE: POSITIVE — AB
Protein, ur: 100 mg/dL — AB
SPECIFIC GRAVITY, URINE: 1.025 (ref 1.005–1.030)
pH: 6 (ref 5.0–8.0)

## 2016-02-21 LAB — PREGNANCY, URINE: Preg Test, Ur: NEGATIVE

## 2016-02-21 MED ORDER — CEPHALEXIN 500 MG PO CAPS: 500.0000 mg | ORAL_CAPSULE | Freq: Two times a day (BID) | ORAL | 0 refills | Status: AC

## 2016-02-21 NOTE — ED Triage Notes (Signed)
Patient c/o burning when urinating and increase in urinary frequency since Sunday.

## 2016-02-21 NOTE — Discharge Instructions (Signed)
Take medication as prescribed. Rest. Drink plenty of fluids.  ° °Follow up with your primary care physician this week as needed. Return to Urgent care for new or worsening concerns.  ° °

## 2016-02-21 NOTE — ED Triage Notes (Signed)
Patient states that she took Plan B pill yesterday.

## 2016-02-21 NOTE — ED Provider Notes (Signed)
[facility] ____________________________________________  Time seen: Approximately 1705 PM  I have reviewed the triage vital signs and the nursing notes.   HISTORY  Chief Complaint Dysuria  HPI Traci Flowers is a 25 y.o. female  presents with complaints of urinary frequency, urinary urgency and some slight burning with urination for the last 2 days. Patient denies any vaginal complaints. Denies vaginal discharge, vaginal odor or vaginal pelvic discomfort. Patient reports she has had a history of urinary tract infections in the past for the same. Patient reports minimal trigger for UTIs in sexual intercourse. Reports last sexual intercourse was 3-4 days ago. Patient reports she usually uses condoms with sexual intercourse. Patient reports she was somewhat concerned after last intercourse and then did take a plan B pill on Saturday. Denies fevers. Denies back pain, abdominal pain or any other discomfort. Reports continues to eat and drink well. Denies recent antibiotic use. Reports sexually active with 1 partner. Denies recent pertinent changes. Denies concerns of STDs.  Patient's last menstrual period was 02/10/2016 (exact date).     Past Medical History:  Diagnosis Date  . UTI (lower urinary tract infection)     There are no active problems to display for this patient.   History reviewed. No pertinent surgical history.  Current Outpatient Rx  . Order #: [id number] Class: Normal    No current facility-administered medications for this encounter.   Current Outpatient Prescriptions:  .  cephALEXin (KEFLEX) 500 MG capsule, Take 1 capsule (500 mg total) by mouth 2 (two) times daily., Disp: 14 capsule, Rfl: 0  Allergies Review of patient's allergies indicates no known allergies.  Family History  Problem Relation Age of Onset  . Schizophrenia Mother   . Autism Brother     Social History Social History  Substance Use Topics  . Smoking status: Never  Smoker  . Smokeless tobacco: Never Used  . Alcohol use Yes     Comment: socially    Review of Systems Constitutional: No fever/chills Eyes: No visual changes. ENT: No sore throat. Cardiovascular: Denies chest pain. Respiratory: Denies shortness of breath. Gastrointestinal: No abdominal pain.  No nausea, no vomiting.  No diarrhea.  No constipation. Genitourinary: Positive for dysuria. Musculoskeletal: Negative for back pain. Skin: Negative for rash. Neurological: Negative for headaches, focal weakness or numbness.  10-point ROS otherwise negative.  ____________________________________________   PHYSICAL EXAM:  VITAL SIGNS: ED Triage Vitals  Enc Vitals Group     BP 02/21/16 1644 116/68     Pulse Rate 02/21/16 1644 70     Resp 02/21/16 1644 16     Temp 02/21/16 1644 97.4 F (36.3 C)     Temp Source 02/21/16 1644 Tympanic     SpO2 02/21/16 1644 100 %     Weight 02/21/16 1643 156 lb (70.8 kg)     Height 02/21/16 1643 5\' 3"  (1.6 m)     Head Circumference --      Peak Flow --      Pain Score 02/21/16 1648 0     Pain Loc --      Pain Edu? --      Excl. in GC? --     Constitutional: Alert and oriented. Well appearing and in no acute distress. Eyes: Conjunctivae are normal. PERRL. EOMI. ENT      Head: Normocephalic and atraumatic.      Mouth/Throat: Mucous membranes are moist. Cardiovascular: Normal rate, regular rhythm. Grossly normal heart sounds.  Good peripheral circulation. Respiratory: Normal respiratory effort without  tachypnea nor retractions. Breath sounds are clear and equal bilaterally. No wheezes/rales/rhonchi.. Gastrointestinal: Soft and nontender. No distention.  No CVA tenderness. Musculoskeletal:  Nontender with normal range of motion in all extremities. No midline cervical, thoracic or lumbar tenderness to palpation.  Neurologic:  Normal speech and language. No gross focal neurologic deficits are appreciated. Speech is normal. No gait instability.  Skin:   Skin is warm, dry and intact. No rash noted. Psychiatric: Mood and affect are normal. Speech and behavior are normal. Patient exhibits appropriate insight and judgment   ___________________________________________   LABS (all labs ordered are listed, but only abnormal results are displayed)  Labs Reviewed  URINALYSIS COMPLETEWITH MICROSCOPIC ([facility] ONLY) - Abnormal; Notable for the following:       Result Value   APPearance CLOUDY (*)    Hgb urine dipstick LARGE (*)    Protein, ur 100 (*)    Nitrite POSITIVE (*)    Leukocytes, UA MODERATE (*)    Bacteria, UA MANY (*)    Squamous Epithelial / LPF 0-5 (*)    All other components within normal limits  URINE CULTURE  PREGNANCY, URINE     PROCEDURES Procedures    INITIAL IMPRESSION / ASSESSMENT AND PLAN / ED COURSE  Pertinent labs & imaging results that were available during my care of the patient were reviewed by me and considered in my medical decision making (see chart for details).  Well-appearing patient. No acute distress. Urinalysis reviewed. Suspect UTI. Will treat patient empirically with oral cephalexin. Will culture urine. Urine pregnancy negative. Counseled regarding safe sex practices. Discussed indication, risks and benefits of medications with patient.  Discussed follow up with Primary care physician this week. Discussed follow up and return parameters including no resolution or any worsening concerns. Patient verbalized understanding and agreed to plan.   ____________________________________________   FINAL CLINICAL IMPRESSION(S) / ED DIAGNOSES  Final diagnoses:  Urinary tract infection with hematuria, site unspecified     Discharge Medication List as of 02/21/2016  6:08 PM    START taking these medications   Details  cephALEXin (KEFLEX) 500 MG capsule Take 1 capsule (500 mg total) by mouth 2 (two) times daily., Starting Mon 02/21/2016, Until Mon 02/28/2016, Normal        Note: This dictation was  prepared with Dragon dictation along with smaller phrase technology. Any transcriptional errors that result from this process are unintentional.    Clinical Course      Renford DillsLindsey Darbi Chandran, NP 02/21/16 1933

## 2016-02-24 LAB — URINE CULTURE: Culture: >=100000 — AB

## 2016-02-25 ENCOUNTER — Telehealth: Payer: Self-pay | Admitting: Emergency Medicine

## 2016-02-25 NOTE — Telephone Encounter (Signed)
Patient returned our phone call.  Patient notified of her urine culture result.  Patient states that her symptoms have improved.  Patient instructed to continue with her antibiotic and to follow-up here or with PCP if her symptoms worsen.  Patient verbalized understanding.

## 2016-04-24 HISTORY — PX: BREAST REDUCTION SURGERY: SHX8

## 2016-05-04 DIAGNOSIS — N62 Hypertrophy of breast: Secondary | ICD-10-CM | POA: Diagnosis not present

## 2016-05-19 DIAGNOSIS — Z9889 Other specified postprocedural states: Secondary | ICD-10-CM | POA: Insufficient documentation

## 2016-12-06 ENCOUNTER — Ambulatory Visit
Admission: EM | Admit: 2016-12-06 | Discharge: 2016-12-06 | Disposition: A | Discharge status: Home / Self Care | Payer: 59 | Attending: Family Medicine | Admitting: Family Medicine

## 2016-12-06 DIAGNOSIS — N390 Urinary tract infection, site not specified: Secondary | ICD-10-CM | POA: Diagnosis not present

## 2016-12-06 DIAGNOSIS — R109 Unspecified abdominal pain: Secondary | ICD-10-CM | POA: Diagnosis not present

## 2016-12-06 LAB — URINALYSIS, COMPLETE (UACMP) WITH MICROSCOPIC
Bilirubin Urine: NEGATIVE
GLUCOSE, UA: NEGATIVE mg/dL
Hgb urine dipstick: NEGATIVE
Nitrite: NEGATIVE
PROTEIN: NEGATIVE mg/dL
RBC / HPF: NONE SEEN RBC/hpf (ref 0–5)
SPECIFIC GRAVITY, URINE: 1.01 (ref 1.005–1.030)
pH: 5.5 (ref 5.0–8.0)

## 2016-12-06 LAB — PREGNANCY, URINE: Preg Test, Ur: NEGATIVE

## 2016-12-06 MED ORDER — CEPHALEXIN 500 MG PO CAPS: 500.0000 mg | ORAL_CAPSULE | Freq: Two times a day (BID) | ORAL | 0 refills | Status: AC

## 2016-12-06 NOTE — ED Provider Notes (Signed)
[facility] ____________________________________________  Time seen: Approximately 10:22 AM  I have reviewed the triage vital signs and the nursing notes.   HISTORY  Chief Complaint Urinary Frequency   HPI Traci Flowers is a 26 y.o. female presenting for reevaluation of urinary frequency, urinary urgency and discomfort with urination. Patient states that she's been to see symptoms the last couple of days, but reports taking over-the-counter cranberry pills helped ease symptoms until yesterday. Patient states yesterday symptoms increased again prompting her to be evaluated today. States does have some low suprapubic discomfort and pressure, denies other abdominal pain. Denies vomiting and diarrhea. Reports sexually active with one partner, declines concerns of STDs. States no vaginal discharge, vaginal odor or vaginal complaints. Denies known trigger. Reports has had urinary tract infections in the past for similar presentation. States last UTI greater than 6 months ago. Denies any recent antibiotic use. Denies antibiotic resistance and urine prior. No other of your medication taken for the same complaint. Denies other aggravating or alleviating factors. Reports otherwise feels well. Denies recent sickness. Denies recent antibiotic use.   Patient's last menstrual period was 11/22/2016.  Titus Mould, NP: PCP   Past Medical History:  Diagnosis Date  . UTI (lower urinary tract infection)     There are no active problems to display for this patient.   Past Surgical History:  Procedure Laterality Date  . BREAST REDUCTION SURGERY  04/2016     No current facility-administered medications for this encounter.   Current Outpatient Prescriptions:  .  cephALEXin (KEFLEX) 500 MG capsule, Take 1 capsule (500 mg total) by mouth 2 (two) times daily., Disp: 14 capsule, Rfl: 0  Allergies Patient has no known allergies.  Family History  Problem Relation Age  of Onset  . Schizophrenia Mother   . Autism Brother     Social History Social History  Substance Use Topics  . Smoking status: Never Smoker  . Smokeless tobacco: Never Used  . Alcohol use Yes     Comment: socially    Review of Systems Constitutional: No fever/chills Cardiovascular: Denies chest pain. Respiratory: Denies shortness of breath. Gastrointestinal: No abdominal pain.  no vomiting.  No diarrhea.  No constipation. Genitourinary: Positive for dysuria. Musculoskeletal: Negative for back pain. Skin: Negative for rash.   ____________________________________________   PHYSICAL EXAM:  VITAL SIGNS: ED Triage Vitals  Enc Vitals Group     BP 12/06/16 0932 (!) 111/56     Pulse Rate 12/06/16 0932 68     Resp 12/06/16 0932 17     Temp 12/06/16 0932 98.7 F (37.1 C)     Temp Source 12/06/16 0932 Oral     SpO2 12/06/16 0932 100 %     Weight 12/06/16 0929 154 lb (69.9 kg)     Height 12/06/16 0929 5\' 3"  (1.6 m)     Head Circumference --      Peak Flow --      Pain Score 12/06/16 0929 4     Pain Loc --      Pain Edu? --      Excl. in GC? --     Constitutional: Alert and oriented. Well appearing and in no acute distress. Cardiovascular: Normal rate, regular rhythm. Grossly normal heart sounds.  Good peripheral circulation. Respiratory: Normal respiratory effort without tachypnea nor retractions. Breath sounds are clear and equal bilaterally. No wheezes, rales, rhonchi. Gastrointestinal: Soft and nontender.  No CVA tenderness. Musculoskeletal:  No midline cervical, thoracic or lumbar tenderness to palpation.  Neurologic:  Normal speech and language.Speech is normal. No gait instability.  Skin:  Skin is warm, dry Psychiatric: Mood and affect are normal. Speech and behavior are normal. Patient exhibits appropriate insight and judgment   ___________________________________________   LABS (all labs ordered are listed, but only abnormal results are displayed)  Labs  Reviewed  URINALYSIS, COMPLETE (UACMP) WITH MICROSCOPIC - Abnormal; Notable for the following:       Result Value   Color, Urine STRAW (*)    Ketones, ur TRACE (*)    Leukocytes, UA TRACE (*)    Squamous Epithelial / LPF 0-5 (*)    Bacteria, UA FEW (*)    All other components within normal limits  URINE CULTURE  PREGNANCY, URINE    PROCEDURES Procedures   INITIAL IMPRESSION / ASSESSMENT AND PLAN / ED COURSE  Pertinent labs & imaging results that were available during my care of the patient were reviewed by me and considered in my medical decision making (see chart for details).  Well-appearing patient. Presents for evaluation of dysuria. Urinalysis reviewed. Patient denies vaginal complaints. Discussed with patient not clear UTI and encouraged to monitor for other triggers. Will culture urine. Suspicious for UTI and will empirically start patient on oral Keflex. Urine pregnancy negative. Encouraged rest, fluids, supportive care. Patient states that she is presented and told to follow-up with urologist, urology information given.Discussed indication, risks and benefits of medications with patient.  Discussed follow up with Primary care physician this week. Discussed follow up and return parameters including no resolution or any worsening concerns. Patient verbalized understanding and agreed to plan.   ____________________________________________   FINAL CLINICAL IMPRESSION(S) / ED DIAGNOSES  Final diagnoses:  Urinary tract infection without hematuria, site unspecified     Discharge Medication List as of 12/06/2016 10:33 AM    START taking these medications   Details  cephALEXin (KEFLEX) 500 MG capsule Take 1 capsule (500 mg total) by mouth 2 (two) times daily., Starting Wed 12/06/2016, Until Wed 12/13/2016, Normal        Note: This dictation was prepared with Dragon dictation along with smaller phrase technology. Any transcriptional errors that result from this process are  unintentional.         Renford DillsMiller, Jeston Junkins, NP 12/06/16 1253

## 2016-12-06 NOTE — ED Triage Notes (Signed)
Patient complains of urinary frequency, lower abdominal pressure, full sensation after urinating x 2-3 days ago with worsening symptoms last night.

## 2016-12-06 NOTE — Discharge Instructions (Signed)
Take medication as prescribed. Rest. Drink plenty of fluids. Monitor for trigger.   Follow up with your primary care physician this week as needed. Return to Urgent care for new or worsening concerns.

## 2016-12-08 LAB — URINE CULTURE

## 2017-02-07 ENCOUNTER — Ambulatory Visit (INDEPENDENT_AMBULATORY_CARE_PROVIDER_SITE_OTHER): Payer: 59 | Admitting: Obstetrics and Gynecology

## 2017-02-07 ENCOUNTER — Encounter: Payer: Self-pay | Admitting: Obstetrics and Gynecology

## 2017-02-07 VITALS — BP 110/60 | HR 62 | Ht 63.0 in | Wt 157.0 lb

## 2017-02-07 DIAGNOSIS — Z01419 Encounter for gynecological examination (general) (routine) without abnormal findings: Secondary | ICD-10-CM | POA: Diagnosis not present

## 2017-02-07 DIAGNOSIS — Z124 Encounter for screening for malignant neoplasm of cervix: Secondary | ICD-10-CM | POA: Diagnosis not present

## 2017-02-07 DIAGNOSIS — Z113 Encounter for screening for infections with a predominantly sexual mode of transmission: Secondary | ICD-10-CM

## 2017-02-07 DIAGNOSIS — Z3045 Encounter for surveillance of transdermal patch hormonal contraceptive device: Secondary | ICD-10-CM

## 2017-02-07 DIAGNOSIS — R809 Proteinuria, unspecified: Secondary | ICD-10-CM

## 2017-02-07 LAB — POCT URINALYSIS DIPSTICK
Bilirubin, UA: NEGATIVE
Blood, UA: NEGATIVE
Glucose, UA: NEGATIVE
Ketones, UA: NEGATIVE
LEUKOCYTES UA: NEGATIVE
Nitrite, UA: NEGATIVE
Spec Grav, UA: 1.015 (ref 1.010–1.025)
pH, UA: 5 (ref 5.0–8.0)

## 2017-02-07 MED ORDER — XULANE 150-35 MCG/24HR TD PTWK: 1.0000 | MEDICATED_PATCH | TRANSDERMAL | 12 refills | Status: DC

## 2017-02-07 NOTE — Progress Notes (Signed)
PCP:  Titus Mould, NP   Chief Complaint  Patient presents with  . Gynecologic Exam     HPI:      [name] is a 26 y.o. G0P0000 who LMP was Patient's last menstrual period was 01/18/2017., presents today for her annual examination.  Her menses are regular every 28-30 days, lasting 5 days.  Dysmenorrhea none. She does not have intermenstrual bleeding.  Sex activity: single partner, contraception - Ortho-Evra patches weekly.  Last Pap: February 07, 2016  Results were: no abnormalities  Hx of STDs: none  There is no FH of breast cancer. There is no FH of ovarian cancer. The patient does do self-breast exams.  Tobacco use: The patient denies current or previous tobacco use. Alcohol use: social drinker No drug use.  Exercise: not active  She does get adequate calcium and Vitamin D in her diet.  Pt has had several UTIs recently. Always has protein on dipsticks. Pt would like checked when doesn't have UTI.  No UTI sx today.  Past Medical History:  Diagnosis Date  . UTI (lower urinary tract infection)     Past Surgical History:  Procedure Laterality Date  . BREAST REDUCTION SURGERY  04/2016    Family History  Problem Relation Age of Onset  . Schizophrenia Mother   . Autism Brother   . Heart disease Maternal Grandfather     Social History   Social History  . Marital status: Single    Spouse name: N/A  . Number of children: N/A  . Years of education: N/A   Occupational History  . Not on file.   Social History Main Topics  . Smoking status: Never Smoker  . Smokeless tobacco: Never Used  . Alcohol use Yes     Comment: socially  . Drug use: No  . Sexual activity: Yes    Birth control/ protection: Patch   Other Topics Concern  . Not on file   Social History Narrative  . No narrative on file    No outpatient prescriptions have been marked as taking for the 02/07/17 encounter (Office Visit) with Jabri Blancett, Ilona Sorrel, PA-C.      ROS:  Review of Systems  Constitutional: Negative for fatigue, fever and unexpected weight change.  Respiratory: Negative for cough, shortness of breath and wheezing.   Cardiovascular: Negative for chest pain, palpitations and leg swelling.  Gastrointestinal: Negative for blood in stool, constipation, diarrhea, nausea and vomiting.  Endocrine: Negative for cold intolerance, heat intolerance and polyuria.  Genitourinary: Negative for dyspareunia, dysuria, flank pain, frequency, genital sores, hematuria, menstrual problem, pelvic pain, urgency, vaginal bleeding, vaginal discharge and vaginal pain.  Musculoskeletal: Negative for back pain, joint swelling and myalgias.  Skin: Negative for rash.  Neurological: Negative for dizziness, syncope, light-headedness, numbness and headaches.  Hematological: Negative for adenopathy.  Psychiatric/Behavioral: Negative for agitation, confusion, sleep disturbance and suicidal ideas. The patient is not nervous/anxious.      Objective: BP 110/60   Pulse 62   Ht 5\' 3"  (1.6 m)   Wt 157 lb (71.2 kg)   LMP 01/18/2017   BMI 27.81 kg/m    Physical Exam  Constitutional: She is oriented to person, place, and time. She appears well-developed and well-nourished.  Genitourinary: Vagina normal and uterus normal. There is no rash or tenderness on the right labia. There is no rash or tenderness on the left labia. No erythema or tenderness in the vagina. No vaginal discharge found. Right adnexum does not display  mass and does not display tenderness. Left adnexum does not display mass and does not display tenderness. Cervix does not exhibit motion tenderness or polyp. Uterus is not enlarged or tender.  Neck: Normal range of motion. No thyromegaly present.  Cardiovascular: Normal rate, regular rhythm and normal heart sounds.   No murmur heard. Pulmonary/Chest: Effort normal and breath sounds normal. Right breast exhibits no mass, no nipple discharge, no skin  change and no tenderness. Left breast exhibits no mass, no nipple discharge, no skin change and no tenderness.  Abdominal: Soft. There is no tenderness. There is no guarding.  Musculoskeletal: Normal range of motion.  Neurological: She is alert and oriented to person, place, and time. No cranial nerve deficit.  Psychiatric: She has a normal mood and affect. Her behavior is normal.  Vitals reviewed.   Results: Results for orders placed or performed in visit on 02/07/17 (from the past 24 hour(s))  POCT Urinalysis Dipstick     Status: Normal   Collection Time: 02/07/17  1:58 PM  Result Value Ref Range   Color, UA yellow    Clarity, UA     Glucose, UA neg    Bilirubin, UA neg    Ketones, UA neg    Spec Grav, UA 1.015 1.010 - 1.025   Blood, UA neg    pH, UA 5.0 5.0 - 8.0   Protein, UA trace    Urobilinogen, UA  0.2 or 1.0 E.U./dL   Nitrite, UA neg    Leukocytes, UA Negative Negative    Assessment/Plan: Encounter for annual routine gynecological examination  Cervical cancer screening - Plan: IGP,CtNgTv,rfx Aptima HPV ASCU, CANCELED: IGP, rfx Aptima HPV ASCU  Screening for STD (sexually transmitted disease) - Pt also wants HIV testing. - Plan: HIV antibody, IGP,CtNgTv,rfx Aptima HPV ASCU  Encounter for surveillance of transdermal patch hormonal contraceptive device - xulane Rx RF. - Plan: XULANE 150-35 MCG/24HR transdermal patch  Proteinuria, unspecified type - Trace protein on dip. Pt has had it on other dipsticks during UTI. Check CMP. Will f/u with results. - Plan: Comprehensive metabolic panel, POCT Urinalysis Dipstick  Meds ordered this encounter  Medications  . DISCONTBurr Medico: XULANE 150-35 MCG/24HR transdermal patch  . XULANE 150-35 MCG/24HR transdermal patch    Sig: Place 1 patch onto the skin once a week. For 3 wks, then 1 wk off    Dispense:  1 Package    Refill:  12             GYN counsel adequate intake of calcium and vitamin D, diet and exercise     F/U  Return in  about 1 year (around 02/07/2018).  Jackquline Branca B. Davi Kroon, PA-C 02/07/2017 2:05 PM

## 2017-02-08 LAB — HIV ANTIBODY (ROUTINE TESTING W REFLEX): HIV Screen 4th Generation wRfx: NONREACTIVE

## 2017-02-09 LAB — IGP, RFX APTIMA HPV ASCU: PAP Smear Comment: 0

## 2017-02-10 ENCOUNTER — Encounter: Payer: Self-pay | Admitting: Obstetrics and Gynecology

## 2017-02-12 ENCOUNTER — Encounter: Payer: Self-pay | Admitting: Obstetrics and Gynecology

## 2017-02-13 LAB — SPECIMEN STATUS REPORT

## 2017-02-15 ENCOUNTER — Other Ambulatory Visit: Payer: Self-pay | Admitting: Obstetrics and Gynecology

## 2017-02-15 ENCOUNTER — Encounter: Payer: Self-pay | Admitting: Obstetrics and Gynecology

## 2017-02-15 DIAGNOSIS — Z0000 Encounter for general adult medical examination without abnormal findings: Secondary | ICD-10-CM

## 2017-02-15 DIAGNOSIS — Z0189 Encounter for other specified special examinations: Secondary | ICD-10-CM

## 2017-02-15 NOTE — Progress Notes (Addendum)
Pt needs CMP done (didn't get drawn at her appt with us).

## 2017-02-15 NOTE — Addendum Note (Signed)
Addended by: Althea GrimmerOPLAND,  B on: 02/15/2017 11:23 AM   Modules accepted: Orders

## 2017-03-19 DIAGNOSIS — K219 Gastro-esophageal reflux disease without esophagitis: Secondary | ICD-10-CM | POA: Diagnosis not present

## 2017-03-19 DIAGNOSIS — R809 Proteinuria, unspecified: Secondary | ICD-10-CM | POA: Diagnosis not present

## 2017-05-09 ENCOUNTER — Encounter: Payer: Self-pay | Admitting: Psychiatry

## 2017-05-09 ENCOUNTER — Ambulatory Visit: Payer: Self-pay | Admitting: Psychiatry

## 2017-05-09 VITALS — BP 102/60 | HR 78 | Temp 98.3°F | Resp 16 | Wt 156.0 lb

## 2017-05-09 DIAGNOSIS — L239 Allergic contact dermatitis, unspecified cause: Secondary | ICD-10-CM

## 2017-05-09 MED ORDER — PREDNISONE 10 MG PO TABS: 10.0000 mg | ORAL_TABLET | Freq: Every day | ORAL | 0 refills | Status: DC

## 2017-05-09 NOTE — Patient Instructions (Signed)

## 2017-05-09 NOTE — Addendum Note (Signed)
Addended by: Ferne ReusKONKWO, Elihue Ebert A on: 05/09/2017 01:02 PM   Modules accepted: Orders

## 2017-05-09 NOTE — Progress Notes (Addendum)
Subjective:     Traci Flowers is a 27 y.o. female who presents for evaluation of a rash involving the hand, leg and lower extremity. Rash started 3 days ago. Lesions are pink, and raised in texture. Rash gets better in the morning and worse at night. Rash is pruritic. Associated symptoms: none. Patient denies: any other symptoms. Patient has not had contacts with similar rash. Patient has had new exposures (soaps, lotions, laundry detergents, foods, medications, plants, insects or animals).  Patient reported that she was give a solu medrol 60mg  IM this morning at work around 0400 and that has helped some so far to control the itching.   The following portions of the patient's history were reviewed and updated as appropriate: allergies, current medications, past family history, past medical history, past social history, past surgical history and problem list.  Review of Systems Integument/breast: positive for rash  Every other system is reviewed and are negative.  Objective:    BP 102/60   Pulse 78   Temp 98.3 F (36.8 C)   Resp 16   Wt 156 lb (70.8 kg)   SpO2 96%   BMI 27.63 kg/m  General:  alert, cooperative and appears stated age  Skin:  rash noted on extremities and right hand     Assessment:    contact dermatitis: unknown allergen    Plan:  Start the Prednisone tomorrow. Okay to use OTC benadryl for the itching.  Medications: steroids: prednisone taper. Written patient instruction given. Follow up in PRN ..Marland Kitchen

## 2017-12-31 ENCOUNTER — Encounter: Payer: Self-pay | Admitting: Obstetrics and Gynecology

## 2018-02-09 ENCOUNTER — Ambulatory Visit
Admission: EM | Admit: 2018-02-09 | Discharge: 2018-02-09 | Disposition: A | Discharge status: Home / Self Care | Payer: BLUE CROSS/BLUE SHIELD | Attending: Internal Medicine | Admitting: Internal Medicine

## 2018-02-09 DIAGNOSIS — N760 Acute vaginitis: Secondary | ICD-10-CM | POA: Diagnosis not present

## 2018-02-09 DIAGNOSIS — B9689 Other specified bacterial agents as the cause of diseases classified elsewhere: Secondary | ICD-10-CM | POA: Diagnosis not present

## 2018-02-09 LAB — URINALYSIS, COMPLETE (UACMP) WITH MICROSCOPIC
Bilirubin Urine: NEGATIVE
GLUCOSE, UA: NEGATIVE mg/dL
Ketones, ur: 40 mg/dL — AB
NITRITE: NEGATIVE
Specific Gravity, Urine: 1.025 (ref 1.005–1.030)
pH: 5.5 (ref 5.0–8.0)

## 2018-02-09 LAB — WET PREP, GENITAL
Sperm: NONE SEEN
Trich, Wet Prep: NONE SEEN
Yeast Wet Prep HPF POC: NONE SEEN

## 2018-02-09 LAB — PREGNANCY, URINE: Preg Test, Ur: NEGATIVE

## 2018-02-09 LAB — CHLAMYDIA/NGC RT PCR (ARMC ONLY)
CHLAMYDIA TR: NOT DETECTED
N gonorrhoeae: DETECTED — AB

## 2018-02-09 MED ORDER — METRONIDAZOLE 0.75 % VA GEL: VAGINAL | 0 refills | Status: DC

## 2018-02-09 NOTE — Discharge Instructions (Signed)
Make sure you see your Gynecologist  for follow up and get blood STD testing done when you see her in 2 weeks.

## 2018-02-09 NOTE — ED Provider Notes (Signed)
[facility]    CSN: [id number] Arrival date & time: 02/09/18  7829     History   Chief Complaint Chief Complaint  Patient presents with  . Vaginitis    HPI Traci Flowers is a 27 y.o. female.   HPI Patient presents today stating that with current new partner had sex using a condom on the ninth and there was no skin to skin contact and within 24 hours developed a yellowish discharge she douched and are completely resolved.  Again they had sex using a condom no penetration initially skin the skin on the 13th, but after he removed the condom there was penetration, but no ejaculation inside her.  She again developed a discharge but this time was white and chunky so she used Monistat over-the-counter for 2 days and now the discharge has turned into a yellowish color she is had some itching which the Monistat has helped and there is no bad odor of the discharge.  Prior to the sexual encounter her prior partner was 2 months ago and at that time there was condom use and no skin to skin contact at all.  The last time she had STD screen was October 2018 and she is due for annual this year and has an appointment in 2 weeks for GYN visit and will have blood STD testing there.  Past Medical History:  Diagnosis Date  . UTI (lower urinary tract infection)   Denies past history of having any STDs.  There are no active problems to display for this patient.   Past Surgical History:  Procedure Laterality Date  . BREAST REDUCTION SURGERY  04/2016    OB History    Gravida  0   Para  0   Term  0   Preterm  0   AB  0   Living  0     SAB  0   TAB  0   Ectopic  0   Multiple  0   Live Births  0            Home Medications    Prior to Admission medications   Medication Sig Start Date End Date Taking? Authorizing Provider  metroNIDAZOLE (METROGEL VAGINAL) 0.75 % vaginal gel One applicator full qhs x 5 nights 02/09/18   Rodriguez-Southworth, Nettie Elm, PA-C      Family History Family History  Problem Relation Age of Onset  . Schizophrenia Mother   . Autism Brother   . Heart disease Maternal Grandfather     Social History Social History   Tobacco Use  . Smoking status: Never Smoker  . Smokeless tobacco: Never Used  Substance Use Topics  . Alcohol use: Yes    Comment: socially  . Drug use: No   SINGLE, HAS FEMALE SEXUAL PARTNER. + CONDOM USE.   Allergies   Patient has no known allergies.   Review of Systems Review of Systems  Constitutional: Negative for chills, diaphoresis and fever.  Gastrointestinal: Negative for abdominal distention, nausea and vomiting.  Genitourinary: Positive for dyspareunia and vaginal discharge. Negative for flank pain, pelvic pain and vaginal pain.       Her periods have been 4 days late this time and last month.  She started her period today.  Skin: Negative for rash.   Physical Exam Triage Vital Signs ED Triage Vitals  Enc Vitals Group     BP 02/09/18 0917 120/72     Pulse Rate 02/09/18 0917 64     Resp  02/09/18 0917 18     Temp 02/09/18 0917 (!) 97.5 F (36.4 C)     Temp Source 02/09/18 0917 Oral     SpO2 02/09/18 0917 99 %     Weight 02/09/18 0919 160 lb (72.6 kg)     Height --      Head Circumference --      Peak Flow --      Pain Score 02/09/18 0919 0     Pain Loc --      Pain Edu? --      Excl. in GC? --    No data found.  Updated Vital Signs BP 120/72 (BP Location: Right Arm)   Pulse 64   Temp (!) 97.5 F (36.4 C) (Oral)   Resp 18   Wt 160 lb (72.6 kg)   LMP 02/09/2018 (Exact Date)   SpO2 99%   BMI 28.34 kg/m   Visual Acuity Right Eye Distance:   Left Eye Distance:   Bilateral Distance:    Right Eye Near:   Left Eye Near:    Bilateral Near:     Physical Exam  Constitutional: She is oriented to person, place, and time. She appears well-developed and well-nourished. No distress.  HENT:  Head: Normocephalic.  Right Ear: External ear normal.  Left Ear: External  ear normal.  Nose: Nose normal.  Eyes: Conjunctivae are normal. No scleral icterus.  Neck: Neck supple.  Pulmonary/Chest: Effort normal.  Abdominal: Soft. Bowel sounds are normal. She exhibits no distension and no mass. There is no tenderness. There is no rebound and no guarding.  No CVA tenderness  Genitourinary:  Genitourinary Comments: I offered her to do a pelvic exam if she wanted me to vs doing a self swab and she chose self swab.   Musculoskeletal: Normal range of motion.  Neurological: She is alert and oriented to person, place, and time.  Skin: Skin is warm and dry. She is not diaphoretic.  Psychiatric: She has a normal mood and affect. Her behavior is normal. Judgment and thought content normal.   UC Treatments / Results  Labs (all labs ordered are listed, but only abnormal results are displayed) Labs Reviewed  WET PREP, GENITAL - Abnormal; Notable for the following components:      Result Value   Clue Cells Wet Prep HPF POC PRESENT (*)    WBC, Wet Prep HPF POC FEW (*)    All other components within normal limits  URINALYSIS, COMPLETE (UACMP) WITH MICROSCOPIC - Abnormal; Notable for the following components:   APPearance HAZY (*)    Hgb urine dipstick LARGE (*)    Ketones, ur 40 (*)    Protein, ur TRACE (*)    Leukocytes, UA SMALL (*)    Bacteria, UA RARE (*)    All other components within normal limits  CHLAMYDIA/NGC RT PCR ([facility] ONLY)  URINE CULTURE  PREGNANCY, URINE    EKG None  Radiology No results found.  Procedures none  Medications Ordered in UC Medications - No data to display  Initial Impression / Assessment and Plan / UC Course  I have reviewed the triage vital signs and the nursing notes.  Pertinent labs- Urine pregnancy test was neg. UA shows large blood but she is on her period, also has small leuks. Wet prep positive for clue cells.    She was placed on Metrogel vaginal qhs x 5 nights.  Urine was sent out for a culture Vaginal  swab sent out for GC/Chalmydia.  Final Clinical Impressions(s) / UC Diagnoses   Final diagnoses:  Bacterial vaginitis     Discharge Instructions     Make sure you see your Gynecologist  for follow up and get blood STD testing done when you see her in 2 weeks.      ED Prescriptions    Medication Sig Dispense Auth. Provider   metroNIDAZOLE (METROGEL VAGINAL) 0.75 % vaginal gel One applicator full qhs x 5 nights 70 g Rodriguez-Southworth, Nettie Elm, PA-C     Controlled Substance Prescriptions [place] Controlled Substance Registry consulted?   Garey Ham, PA-C 02/09/18 1155

## 2018-02-09 NOTE — ED Triage Notes (Signed)
Pt reports sex on Sunday with latex condom and states after she is having vaginal itching, swelling and cottage cheese discharge and thinks she may be allergic. Did try otc monistat and states it did help itching but now having milky yellow discharge and period did come on this morning as well. Wanted to get checked for std and what is causing the discharge.

## 2018-02-11 ENCOUNTER — Telehealth: Payer: Self-pay | Admitting: Emergency Medicine

## 2018-02-11 LAB — URINE CULTURE: SPECIAL REQUESTS: NORMAL

## 2018-02-11 NOTE — Telephone Encounter (Signed)
Attempted to reach patient x1 at number provided.  No answer and no voicemail set up. Letter mailed to patient.  Gonorrhea is positive.  Patient should return as soon as possible to the urgent care for treatment with IM rocephin 250mg  and po zithromax 1g. Patient will not need to see a provider unless there are new symptoms she would like evaluated. Pt needs education to refrain from sexual intercourse for now and for 7 days after treatment to give the medicine time to work. Sexual partners need to be notified and tested/treated. Condoms may reduce risk of reinfection. [facility] notified

## 2018-02-11 NOTE — Telephone Encounter (Signed)
Patient called in re: lab results. Advised patient of results and necessary treatment per previous telephone encounter. Patient agreed, voiced understanding and states that she will RTC for treatment tomorrow (02/12/18).

## 2018-02-12 ENCOUNTER — Ambulatory Visit
Admission: EM | Admit: 2018-02-12 | Discharge: 2018-02-12 | Disposition: A | Discharge status: Home / Self Care | Payer: BLUE CROSS/BLUE SHIELD | Attending: Family Medicine | Admitting: Family Medicine

## 2018-02-12 DIAGNOSIS — Z0289 Encounter for other administrative examinations: Secondary | ICD-10-CM

## 2018-02-12 MED ORDER — AZITHROMYCIN 500 MG PO TABS
1000.0000 mg | ORAL_TABLET | Freq: Once | ORAL | Status: AC
  Administered 2018-02-12: 1000 mg via ORAL

## 2018-02-12 MED ORDER — CEFTRIAXONE SODIUM 250 MG IJ SOLR
250.0000 mg | Freq: Once | INTRAMUSCULAR | Status: AC
  Administered 2018-02-12: 250 mg via INTRAMUSCULAR

## 2018-02-12 NOTE — ED Triage Notes (Signed)
Patient is here for treatment of Gonorrhea. Order given by Dr. Adriana Simas for 250 IM Rocephin and 1Gram of Azithromycin.

## 2018-02-15 ENCOUNTER — Encounter: Payer: Self-pay | Admitting: Obstetrics and Gynecology

## 2018-02-16 ENCOUNTER — Other Ambulatory Visit: Payer: Self-pay | Admitting: Obstetrics and Gynecology

## 2018-02-16 MED ORDER — FLUCONAZOLE 150 MG PO TABS: 150.0000 mg | ORAL_TABLET | Freq: Once | ORAL | 0 refills | Status: AC

## 2018-02-16 NOTE — Progress Notes (Signed)
Rx due to yeast vag sx with abx use

## 2018-02-18 ENCOUNTER — Telehealth: Payer: Self-pay | Admitting: Emergency Medicine

## 2018-02-18 NOTE — Telephone Encounter (Signed)
Health Dept notified.

## 2018-02-20 ENCOUNTER — Encounter: Payer: Self-pay | Admitting: Obstetrics and Gynecology

## 2018-02-20 ENCOUNTER — Other Ambulatory Visit (HOSPITAL_COMMUNITY)
Admission: RE | Admit: 2018-02-20 | Discharge: 2018-02-20 | Disposition: A | Discharge status: Home / Self Care | Payer: BLUE CROSS/BLUE SHIELD | Source: Ambulatory Visit | Attending: Obstetrics and Gynecology | Admitting: Obstetrics and Gynecology

## 2018-02-20 ENCOUNTER — Ambulatory Visit (INDEPENDENT_AMBULATORY_CARE_PROVIDER_SITE_OTHER): Payer: BLUE CROSS/BLUE SHIELD | Admitting: Obstetrics and Gynecology

## 2018-02-20 VITALS — BP 90/58 | HR 69 | Ht 63.0 in | Wt 160.0 lb

## 2018-02-20 DIAGNOSIS — Z8049 Family history of malignant neoplasm of other genital organs: Secondary | ICD-10-CM | POA: Diagnosis not present

## 2018-02-20 DIAGNOSIS — N760 Acute vaginitis: Secondary | ICD-10-CM

## 2018-02-20 DIAGNOSIS — Z113 Encounter for screening for infections with a predominantly sexual mode of transmission: Secondary | ICD-10-CM | POA: Diagnosis not present

## 2018-02-20 DIAGNOSIS — Z30016 Encounter for initial prescription of transdermal patch hormonal contraceptive device: Secondary | ICD-10-CM

## 2018-02-20 DIAGNOSIS — R801 Persistent proteinuria, unspecified: Secondary | ICD-10-CM

## 2018-02-20 DIAGNOSIS — Z8041 Family history of malignant neoplasm of ovary: Secondary | ICD-10-CM | POA: Diagnosis not present

## 2018-02-20 DIAGNOSIS — Z803 Family history of malignant neoplasm of breast: Secondary | ICD-10-CM

## 2018-02-20 DIAGNOSIS — Z124 Encounter for screening for malignant neoplasm of cervix: Secondary | ICD-10-CM | POA: Insufficient documentation

## 2018-02-20 DIAGNOSIS — Z01419 Encounter for gynecological examination (general) (routine) without abnormal findings: Secondary | ICD-10-CM

## 2018-02-20 DIAGNOSIS — A549 Gonococcal infection, unspecified: Secondary | ICD-10-CM | POA: Insufficient documentation

## 2018-02-20 DIAGNOSIS — R102 Pelvic and perineal pain: Secondary | ICD-10-CM

## 2018-02-20 MED ORDER — CLOTRIMAZOLE-BETAMETHASONE 1-0.05 % EX CREA: TOPICAL_CREAM | CUTANEOUS | 0 refills | Status: DC

## 2018-02-20 MED ORDER — NORELGESTROMIN-ETH ESTRADIOL 150-35 MCG/24HR TD PTWK: 1.0000 | MEDICATED_PATCH | TRANSDERMAL | 3 refills | Status: DC

## 2018-02-20 NOTE — Patient Instructions (Signed)
I value your feedback and entrusting us with your care. If you get a [facility] patient survey, I would appreciate you taking the time to let us know about your experience today. Thank you! 

## 2018-02-20 NOTE — Progress Notes (Signed)
PCP:  [name], MD   Chief Complaint  Patient presents with  . Gynecologic Exam    pt would like hiv and std testing, pelvic pain x one week     HPI:      [name] is a 27 y.o. G0P0000 who LMP was Patient's last menstrual period was 02/09/2018 (exact date)., presents today for her annual examination.  Her menses are regular every 5 weeks off BC, lasting 5-7 days.  Dysmenorrhea mild. She does not have intermenstrual bleeding.   Sex activity: single partner, contraception - condoms. Stopped xulane when not active. Would like to restart.   Last Pap: 02/07/17 Results were: no abnormalities  Hx of STDs: gonorrhea 10/19 at ED. Treated with rocephin. Due for TOC and wants full STD testing. Pt has had intermittent sharp, shooting pelvic pain, radiating to groin and vagina for past wk (since rocephin inj). No fevers, GI sx, urin sx. Had vaginal itching/irritation after abx tx and treated with diflucan 4 days ago. Sx improved but still feels "raw" ext.   There is a FH of breast cancer in her pat aunt. There is a possible FH of ovarian cancer in her MGM. Genetic testing not done. The patient does not do self-breast exams.  Tobacco use: The patient denies current or previous tobacco use. Alcohol use: social drinker No drug use.  Exercise: not active  She does get adequate calcium and Vitamin D in her diet.  Hx of proteinuria on UA with hx of recent UTIs last yr. CMP was supposed to be checked but didn't get drawn. Will check this yr.   Past Medical History:  Diagnosis Date  . BV (bacterial vaginosis)   . Family history of breast cancer   . Gonorrhea   . UTI (lower urinary tract infection)     Past Surgical History:  Procedure Laterality Date  . BREAST REDUCTION SURGERY  04/2016    Family History  Problem Relation Age of Onset  . Schizophrenia Mother   . Autism Brother   . Heart disease Maternal Grandfather   . Breast cancer Paternal Aunt 12  .  Ovarian cancer Maternal Grandmother 66       "female cancer" ? primary site--extensive spread    Social History   Socioeconomic History  . Marital status: Single    Spouse name: Not on file  . Number of children: Not on file  . Years of education: Not on file  . Highest education level: Not on file  Occupational History  . Not on file  Social Needs  . Financial resource strain: Not on file  . Food insecurity:    Worry: Not on file    Inability: Not on file  . Transportation needs:    Medical: Not on file    Non-medical: Not on file  Tobacco Use  . Smoking status: Never Smoker  . Smokeless tobacco: Never Used  Substance and Sexual Activity  . Alcohol use: Yes    Comment: socially  . Drug use: No  . Sexual activity: Yes    Birth control/protection: None  Lifestyle  . Physical activity:    Days per week: Not on file    Minutes per session: Not on file  . Stress: Not on file  Relationships  . Social connections:    Talks on phone: Not on file    Gets together: Not on file    Attends religious service: Not on file    Active member of  club or organization: Not on file    Attends meetings of clubs or organizations: Not on file    Relationship status: Not on file  . Intimate partner violence:    Fear of current or ex partner: Not on file    Emotionally abused: Not on file    Physically abused: Not on file    Forced sexual activity: Not on file  Other Topics Concern  . Not on file  Social History Narrative  . Not on file    No outpatient medications have been marked as taking for the 02/20/18 encounter (Office Visit) with Copland, Deirdre Evener, PA-C.     ROS:  Review of Systems  Constitutional: Negative for fatigue, fever and unexpected weight change.  Respiratory: Negative for cough, shortness of breath and wheezing.   Cardiovascular: Negative for chest pain, palpitations and leg swelling.  Gastrointestinal: Negative for blood in stool, constipation, diarrhea,  nausea and vomiting.  Endocrine: Negative for cold intolerance, heat intolerance and polyuria.  Genitourinary: Negative for dyspareunia, dysuria, flank pain, frequency, genital sores, hematuria, menstrual problem, pelvic pain, urgency, vaginal bleeding, vaginal discharge and vaginal pain.  Musculoskeletal: Negative for back pain, joint swelling and myalgias.  Skin: Negative for rash.  Neurological: Negative for dizziness, syncope, light-headedness, numbness and headaches.  Hematological: Negative for adenopathy.  Psychiatric/Behavioral: Negative for agitation, confusion, sleep disturbance and suicidal ideas. The patient is not nervous/anxious.      Objective: BP (!) 90/58   Pulse 69   Ht '5\' 3"'$  (1.6 m)   Wt 160 lb (72.6 kg)   LMP 02/09/2018 (Exact Date)   BMI 28.34 kg/m    Physical Exam  Constitutional: She is oriented to person, place, and time. She appears well-developed and well-nourished.  Genitourinary: Vagina normal and uterus normal. There is no rash or tenderness on the right labia. There is no rash or tenderness on the left labia. No erythema or tenderness in the vagina. No vaginal discharge found. Right adnexum does not display mass and does not display tenderness. Left adnexum does not display mass and does not display tenderness. Cervix does not exhibit motion tenderness or polyp. Uterus is not enlarged or tender.  Neck: Normal range of motion. No thyromegaly present.  Cardiovascular: Normal rate, regular rhythm and normal heart sounds.  No murmur heard. Pulmonary/Chest: Effort normal and breath sounds normal. Right breast exhibits no mass, no nipple discharge, no skin change and no tenderness. Left breast exhibits no mass, no nipple discharge, no skin change and no tenderness.  Abdominal: Soft. There is no tenderness. There is no guarding.  Musculoskeletal: Normal range of motion.  Neurological: She is alert and oriented to person, place, and time. No cranial nerve deficit.    Psychiatric: She has a normal mood and affect. Her behavior is normal.  Vitals reviewed.   Assessment/Plan: Encounter for annual routine gynecological examination  Cervical cancer screening - Plan: Cytology - PAP  Screening for STD (sexually transmitted disease) - Pt wants full STD testing. - Plan: Cytology - PAP, HIV Antibody (routine testing w rflx), RPR, Hepatitis C antibody, HSV 2 antibody, IgG  Gonorrhea - Test of cure today. Will call with results. - Plan: Cytology - PAP  Encounter for initial prescription of transdermal patch hormonal contraceptive device - Xulane restart with next menses/condoms. Rx eRxd.  - Plan: norelgestromin-ethinyl estradiol Marilu Favre) 150-35 MCG/24HR transdermal patch  Persistent proteinuria - On UA in past. Check CMP. - Plan: Comprehensive metabolic panel  Family history of breast cancer - MyRisk  testing discussed and done today. RTO in 6 wks for results. - Plan: Integrated [facility] ([facility])  Acute vaginitis - Treated with diflucan a few days ago after abx use. Try Rx lotrisone crm. F/u prn.  - Plan: clotrimazole-betamethasone (LOTRISONE) cream  Pelvic pain - Neg exam. Check gon TOC. Will call with results. If sx persist, will check u/s.   Meds ordered this encounter  Medications  . clotrimazole-betamethasone (LOTRISONE) cream    Sig: Apply externally BID prn sx up to 2 wks    Dispense:  15 g    Refill:  0    Order Specific Question:   Supervising Provider    Answer:   Gae Dry [id number]  . norelgestromin-ethinyl estradiol Marilu Favre) 150-35 MCG/24HR transdermal patch    Sig: Place 1 patch onto the skin once a week. Apply 1 patch weekly for 3 weeks, then 1 week without patch    Dispense:  9 patch    Refill:  3    Order Specific Question:   Supervising Provider    Answer:   Gae Dry [[id number]]             GYN counsel adequate intake of calcium and vitamin D, diet and exercise     F/U  Return in about 6 weeks  (around 04/03/2018) for [facility] f/u.  Alicia B. Copland, PA-C 02/20/2018 5:05 PM

## 2018-02-21 LAB — COMPREHENSIVE METABOLIC PANEL
A/G RATIO: 1.7 (ref 1.2–2.2)
ALT: 10 IU/L (ref 0–32)
AST: 17 IU/L (ref 0–40)
Albumin: 4.3 g/dL (ref 3.5–5.5)
Alkaline Phosphatase: 45 IU/L (ref 39–117)
BILIRUBIN TOTAL: 0.4 mg/dL (ref 0.0–1.2)
BUN / CREAT RATIO: 13 (ref 9–23)
BUN: 12 mg/dL (ref 6–20)
CHLORIDE: 100 mmol/L (ref 96–106)
CO2: 25 mmol/L (ref 20–29)
Calcium: 9.5 mg/dL (ref 8.7–10.2)
Creatinine, Ser: 0.9 mg/dL (ref 0.57–1.00)
GFR calc Af Amer: 100 mL/min/{1.73_m2} (ref >59)
GFR calc non Af Amer: 87 mL/min/{1.73_m2} (ref >59)
GLOBULIN, TOTAL: 2.5 g/dL (ref 1.5–4.5)
Glucose: 89 mg/dL (ref 65–99)
POTASSIUM: 4 mmol/L (ref 3.5–5.2)
SODIUM: 140 mmol/L (ref 134–144)
Total Protein: 6.8 g/dL (ref 6.0–8.5)

## 2018-02-21 LAB — HEPATITIS C ANTIBODY: Hep C Virus Ab: <0.1 s/co ratio (ref 0.0–0.9)

## 2018-02-21 LAB — RPR: RPR Ser Ql: NONREACTIVE

## 2018-02-21 LAB — HSV 2 ANTIBODY, IGG: HSV 2 IgG, Type Spec: <0.91 index (ref 0.00–0.90)

## 2018-02-21 LAB — HIV ANTIBODY (ROUTINE TESTING W REFLEX): HIV Screen 4th Generation wRfx: NONREACTIVE

## 2018-02-22 DIAGNOSIS — Z1371 Encounter for nonprocreative screening for genetic disease carrier status: Secondary | ICD-10-CM

## 2018-02-22 DIAGNOSIS — Z9189 Other specified personal risk factors, not elsewhere classified: Secondary | ICD-10-CM

## 2018-02-22 HISTORY — DX: Encounter for nonprocreative screening for genetic disease carrier status: Z13.71

## 2018-02-22 HISTORY — DX: Other specified personal risk factors, not elsewhere classified: Z91.89

## 2018-02-22 LAB — CYTOLOGY - PAP
CHLAMYDIA, DNA PROBE: NEGATIVE
DIAGNOSIS: NEGATIVE
Neisseria Gonorrhea: NEGATIVE
Trichomonas: NEGATIVE

## 2018-02-22 NOTE — Progress Notes (Signed)
Pls let pt know blood STD tests neg and kidneys are fine. Still waiting on pap smear/gon/chlam results. Will call with those, too. Thx.

## 2018-02-24 NOTE — Progress Notes (Signed)
Pls let pt know pap and STD testing neg. Thx

## 2018-02-25 NOTE — Progress Notes (Signed)
Pt aware.

## 2018-03-05 ENCOUNTER — Encounter: Payer: Self-pay | Admitting: Obstetrics and Gynecology

## 2018-03-09 ENCOUNTER — Encounter: Payer: Self-pay | Admitting: Obstetrics and Gynecology

## 2018-03-12 ENCOUNTER — Ambulatory Visit (INDEPENDENT_AMBULATORY_CARE_PROVIDER_SITE_OTHER): Payer: BLUE CROSS/BLUE SHIELD | Admitting: Obstetrics and Gynecology

## 2018-03-12 ENCOUNTER — Encounter: Payer: Self-pay | Admitting: Obstetrics and Gynecology

## 2018-03-12 VITALS — BP 104/70 | HR 60 | Ht 63.0 in | Wt 162.0 lb

## 2018-03-12 DIAGNOSIS — Z803 Family history of malignant neoplasm of breast: Secondary | ICD-10-CM

## 2018-03-12 DIAGNOSIS — Z7183 Encounter for nonprocreative genetic counseling: Secondary | ICD-10-CM | POA: Diagnosis not present

## 2018-03-12 DIAGNOSIS — B373 Candidiasis of vulva and vagina: Secondary | ICD-10-CM

## 2018-03-12 DIAGNOSIS — Z9189 Other specified personal risk factors, not elsewhere classified: Secondary | ICD-10-CM | POA: Insufficient documentation

## 2018-03-12 DIAGNOSIS — B3731 Acute candidiasis of vulva and vagina: Secondary | ICD-10-CM

## 2018-03-12 LAB — POCT WET PREP WITH KOH
CLUE CELLS WET PREP PER HPF POC: NEGATIVE
KOH Prep POC: NEGATIVE
Trichomonas, UA: NEGATIVE
Yeast Wet Prep HPF POC: NEGATIVE

## 2018-03-12 NOTE — Patient Instructions (Signed)
I value your feedback and entrusting us with your care. If you get a [facility] patient survey, I would appreciate you taking the time to let us know about your experience today. Thank you! 

## 2018-03-12 NOTE — Progress Notes (Signed)
Traci Hartigan, MD   Chief Complaint  Patient presents with  . Vaginitis    started friday with milky discharge, fishy odor, itching, saturday tried left over flagyl and woke up sunday with more itchiness, then tried monistat made it a little better, currently thin discharge and itchiness and irritation   . MyRisk results    HPI:      [name] is a 27 y.o. G0P0000 who LMP was Patient's last menstrual period was 02/09/2018 (approximate)., presents today for vag sx. Pt with increased thin d/c and fishy odor 5 days ago. Treated with leftover metrogel for 1 dose with relief of odor. Pt then had increased itching and thick d/c next day, like yeast, so started treating with monistat-7 x 1 dose. Pt didn't treat last night since coming in today, but is feeling better. Pt is s/p abx use last mo for gonorrhea. Partner also treated, pt with neg TOC 10/19. Pt is also taking probiotics. No change in soaps/detergents.  Hx of BV in past. No urin sx, no LBP, belly pain, fevers.   Pt also here for [facility] results for FH breast ca. Pt is MyRisk neg except MSH3 VUS. IBIS=21%. Pt is not taking Vit D supp.   Pt is self-pay now.   Past Medical History:  Diagnosis Date  . BRCA negative 02/2018   MyRisk neg except MSH3 VUS  . BV (bacterial vaginosis)   . Family history of breast cancer   . Gonorrhea   . Increased risk of breast cancer 02/2018   IBIS=21%  . UTI (lower urinary tract infection)     Past Surgical History:  Procedure Laterality Date  . BREAST REDUCTION SURGERY  04/2016    Family History  Problem Relation Age of Onset  . Schizophrenia Mother   . Autism Brother   . Heart disease Maternal Grandfather   . Breast cancer Paternal Aunt 80  . Ovarian cancer Maternal Grandmother 44       "female cancer" ? primary site--extensive spread    Social History   Socioeconomic History  . Marital status: Single    Spouse name: Not on file  . Number of children: Not on  file  . Years of education: Not on file  . Highest education level: Not on file  Occupational History  . Not on file  Social Needs  . Financial resource strain: Not on file  . Food insecurity:    Worry: Not on file    Inability: Not on file  . Transportation needs:    Medical: Not on file    Non-medical: Not on file  Tobacco Use  . Smoking status: Never Smoker  . Smokeless tobacco: Never Used  Substance and Sexual Activity  . Alcohol use: Yes    Comment: socially  . Drug use: No  . Sexual activity: Yes    Birth control/protection: None  Lifestyle  . Physical activity:    Days per week: Not on file    Minutes per session: Not on file  . Stress: Not on file  Relationships  . Social connections:    Talks on phone: Not on file    Gets together: Not on file    Attends religious service: Not on file    Active member of club or organization: Not on file    Attends meetings of clubs or organizations: Not on file    Relationship status: Not on file  . Intimate partner violence:    Fear  of current or ex partner: Not on file    Emotionally abused: Not on file    Physically abused: Not on file    Forced sexual activity: Not on file  Other Topics Concern  . Not on file  Social History Narrative  . Not on file    Outpatient Medications Prior to Visit  Medication Sig Dispense Refill  . norelgestromin-ethinyl estradiol Marilu Favre) 150-35 MCG/24HR transdermal patch Place 1 patch onto the skin once a week. Apply 1 patch weekly for 3 weeks, then 1 week without patch (Patient not taking: Reported on 03/12/2018) 9 patch 3  . clotrimazole-betamethasone (LOTRISONE) cream Apply externally BID prn sx up to 2 wks 15 g 0   No facility-administered medications prior to visit.      ROS:  Review of Systems  Constitutional: Negative for fever.  Gastrointestinal: Negative for blood in stool, constipation, diarrhea, nausea and vomiting.  Genitourinary: Positive for vaginal discharge. Negative  for dyspareunia, dysuria, flank pain, frequency, hematuria, urgency, vaginal bleeding and vaginal pain.  Musculoskeletal: Negative for back pain.  Skin: Negative for rash.  BREAST: No symptoms   OBJECTIVE:   Vitals:  BP 104/70   Pulse 60   Ht '5\' 3"'$  (1.6 m)   Wt 162 lb (73.5 kg)   LMP 02/09/2018 (Approximate)   BMI 28.70 kg/m   Physical Exam  Constitutional: She is oriented to person, place, and time. Vital signs are normal. She appears well-developed.  Pulmonary/Chest: Effort normal.  Genitourinary: Uterus normal. There is no rash, tenderness or lesion on the right labia. There is no rash, tenderness or lesion on the left labia. Uterus is not enlarged and not tender. Cervix exhibits no motion tenderness. Right adnexum displays no mass and no tenderness. Left adnexum displays no mass and no tenderness. There is erythema and tenderness in the vagina. Vaginal discharge found.  Musculoskeletal: Normal range of motion.  Neurological: She is alert and oriented to person, place, and time.  Psychiatric: She has a normal mood and affect. Her behavior is normal. Thought content normal.  Vitals reviewed.   Results: Results for orders placed or performed in visit on 03/12/18 (from the past 24 hour(s))  POCT Wet Prep with KOH     Status: Normal   Collection Time: 03/12/18  2:51 PM  Result Value Ref Range   Trichomonas, UA Negative    Clue Cells Wet Prep HPF POC neg    Epithelial Wet Prep HPF POC     Yeast Wet Prep HPF POC neg    Bacteria Wet Prep HPF POC     RBC Wet Prep HPF POC     WBC Wet Prep HPF POC     KOH Prep POC Negative Negative     Assessment/Plan: Candidal vaginitis - Pos sx/exam with neg wet prep, although pt already treated once with monistat-7. Complete tx, f/u prn. Cont probiotics.  - Plan: POCT Wet Prep with KOH  Encounter for nonprocreative genetic counseling  Family history of breast cancer  Increased risk of breast cancer  Recommended monthly SBE, yearly CBE,  yearly mammos starting age 42 and add Vit D3 2000 IU dialy. Also offered yearly screening breast MRI, staggered with mammos.  Patient understands these results only apply to her and her children, and this is not indicative of genetic testing results of her other family members. It is recommended that her other family members have genetic testing done.  Pt also understands negative genetic testing doesn't mean she will never get any of  these cancers.   Results handout given to pt.    Jhamari Markowicz B. Mathew Storck, PA-C 03/12/2018 2:58 PM

## 2018-08-12 ENCOUNTER — Encounter: Payer: Self-pay | Admitting: Obstetrics and Gynecology

## 2018-08-12 ENCOUNTER — Ambulatory Visit (INDEPENDENT_AMBULATORY_CARE_PROVIDER_SITE_OTHER): Payer: 59 | Admitting: Obstetrics and Gynecology

## 2018-08-12 ENCOUNTER — Other Ambulatory Visit: Payer: Self-pay

## 2018-08-12 ENCOUNTER — Other Ambulatory Visit (HOSPITAL_COMMUNITY)
Admission: RE | Admit: 2018-08-12 | Discharge: 2018-08-12 | Disposition: A | Discharge status: Home / Self Care | Payer: 59 | Source: Ambulatory Visit | Attending: Obstetrics and Gynecology | Admitting: Obstetrics and Gynecology

## 2018-08-12 VITALS — BP 100/76 | HR 68 | Ht 63.0 in | Wt 158.0 lb

## 2018-08-12 DIAGNOSIS — N898 Other specified noninflammatory disorders of vagina: Secondary | ICD-10-CM | POA: Insufficient documentation

## 2018-08-12 DIAGNOSIS — Z113 Encounter for screening for infections with a predominantly sexual mode of transmission: Secondary | ICD-10-CM | POA: Diagnosis not present

## 2018-08-12 LAB — POCT WET PREP WITH KOH
Clue Cells Wet Prep HPF POC: NEGATIVE
KOH Prep POC: NEGATIVE
Trichomonas, UA: NEGATIVE
Yeast Wet Prep HPF POC: NEGATIVE

## 2018-08-12 NOTE — Patient Instructions (Signed)
I value your feedback and entrusting us with your care. If you get a [facility] patient survey, I would appreciate you taking the time to let us know about your experience today. Thank you! 

## 2018-08-12 NOTE — Progress Notes (Signed)
[name], MD   Chief Complaint  Patient presents with  . Vaginal Itching    watery discharge at first, as of now, very slightly, no odor/itchiness x 1 week    HPI:      [name] is a 28 y.o. G0P0000 who LMP was Patient's last menstrual period was 07/27/2018 (exact date)., presents today for increased vag d/c without odor/irritation for a wk. Sx started after sex. Pt tried to treat with boric acid for a couple days but that caused irritation, so stopped meds. Irritation then resolved. Pt is sex active with new partner. Sometimes using condoms. Pt feels like she gets yeast vag or BV after sex. No recent abx use. No LBP, pelvic pain, fevers. No urin sx.  Last annual 10/19.   Past Medical History:  Diagnosis Date  . BRCA negative 02/2018   MyRisk neg except MSH3 VUS  . BV (bacterial vaginosis)   . Family history of breast cancer   . Gonorrhea   . Increased risk of breast cancer 02/2018   IBIS=21%  . UTI (lower urinary tract infection)     Past Surgical History:  Procedure Laterality Date  . BREAST REDUCTION SURGERY  04/2016    Family History  Problem Relation Age of Onset  . Schizophrenia Mother   . Autism Brother   . Heart disease Maternal Grandfather   . Breast cancer Paternal Aunt 81  . Ovarian cancer Maternal Grandmother 23       "female cancer" ? primary site--extensive spread    Social History   Socioeconomic History  . Marital status: Single    Spouse name: Not on file  . Number of children: Not on file  . Years of education: Not on file  . Highest education level: Not on file  Occupational History  . Not on file  Social Needs  . Financial resource strain: Not on file  . Food insecurity:    Worry: Not on file    Inability: Not on file  . Transportation needs:    Medical: Not on file    Non-medical: Not on file  Tobacco Use  . Smoking status: Never Smoker  . Smokeless tobacco: Never Used  Substance and Sexual Activity   . Alcohol use: Yes    Comment: socially  . Drug use: No  . Sexual activity: Yes    Birth control/protection: None  Lifestyle  . Physical activity:    Days per week: Not on file    Minutes per session: Not on file  . Stress: Not on file  Relationships  . Social connections:    Talks on phone: Not on file    Gets together: Not on file    Attends religious service: Not on file    Active member of club or organization: Not on file    Attends meetings of clubs or organizations: Not on file    Relationship status: Not on file  . Intimate partner violence:    Fear of current or ex partner: Not on file    Emotionally abused: Not on file    Physically abused: Not on file    Forced sexual activity: Not on file  Other Topics Concern  . Not on file  Social History Narrative  . Not on file    Outpatient Medications Prior to Visit  Medication Sig Dispense Refill  . Calcium Carb-Cholecalciferol (CALCIUM + VITAMIN D3) 500-400 MG-UNIT CHEW Chew by mouth.    . norelgestromin-ethinyl estradiol (  Marilu Favre) 150-35 MCG/24HR transdermal patch Place 1 patch onto the skin once a week. Apply 1 patch weekly for 3 weeks, then 1 week without patch (Patient not taking: Reported on 03/12/2018) 9 patch 3   No facility-administered medications prior to visit.       ROS:  Review of Systems  Constitutional: Negative for fever.  Gastrointestinal: Negative for blood in stool, constipation, diarrhea, nausea and vomiting.  Genitourinary: Positive for vaginal discharge. Negative for dyspareunia, dysuria, flank pain, frequency, hematuria, urgency, vaginal bleeding and vaginal pain.  Musculoskeletal: Negative for back pain.  Skin: Negative for rash.   BREAST: No symptoms   OBJECTIVE:   Vitals:  BP 100/76   Pulse 68   Ht _0  (1.6 m)   Wt 158 lb (71.7 kg)   LMP 07/27/2018 (Exact Date)   BMI 27.99 kg/m   Physical Exam Vitals signs reviewed.  Constitutional:      Appearance: She is well-developed.   Neck:     Musculoskeletal: Normal range of motion.  Pulmonary:     Effort: Pulmonary effort is normal.  Genitourinary:    General: Normal vulva.     Pubic Area: No rash.      Labia:        Right: No rash, tenderness or lesion.        Left: No rash, tenderness or lesion.      Vagina: Vaginal discharge present. No erythema or tenderness.     Cervix: Normal.     Uterus: Normal. Not enlarged and not tender.      Adnexa: Right adnexa normal and left adnexa normal.       Right: No mass or tenderness.         Left: No mass or tenderness.       Comments: THICK, WHITE D/C Musculoskeletal: Normal range of motion.  Skin:    General: Skin is warm and dry.  Neurological:     General: No focal deficit present.     Mental Status: She is alert and oriented to person, place, and time.  Psychiatric:        Mood and Affect: Mood normal.        Behavior: Behavior normal.        Thought Content: Thought content normal.        Judgment: Judgment normal.     Results: Results for orders placed or performed in visit on 08/12/18 (from the past 24 hour(s))  POCT Wet Prep with KOH     Status: Normal   Collection Time: 08/12/18  4:27 PM  Result Value Ref Range   Trichomonas, UA Negative    Clue Cells Wet Prep HPF POC neg    Epithelial Wet Prep HPF POC     Yeast Wet Prep HPF POC neg    Bacteria Wet Prep HPF POC     RBC Wet Prep HPF POC     WBC Wet Prep HPF POC     KOH Prep POC Negative Negative     Assessment/Plan: Vaginal discharge - Pos sx, neg wet prep. Check STD/yeast/BV on swab. Will call with results.  - Plan: Cervicovaginal ancillary only, POCT Wet Prep with KOH  Screening for STD (sexually transmitted disease) - Plan: Cervicovaginal ancillary only    Return if symptoms worsen or fail to improve.  Kyrus Hyde B. Alfonso Shackett, PA-C 08/12/2018 4:28 PM

## 2018-08-14 LAB — CERVICOVAGINAL ANCILLARY ONLY
Bacterial vaginitis: NEGATIVE
Candida vaginitis: NEGATIVE
Chlamydia: NEGATIVE
Neisseria Gonorrhea: NEGATIVE
Trichomonas: NEGATIVE

## 2018-08-14 NOTE — Progress Notes (Signed)
UTR pt.  

## 2018-08-14 NOTE — Progress Notes (Signed)
Can you pls let pt know STD testing, BV and yeast testing all neg. D/C is probably normal. Will continue to follow it along. F/u prn. Thx

## 2018-09-01 ENCOUNTER — Encounter: Payer: Self-pay | Admitting: Obstetrics and Gynecology

## 2018-09-02 ENCOUNTER — Other Ambulatory Visit: Payer: Self-pay | Admitting: Obstetrics and Gynecology

## 2018-09-02 MED ORDER — CLINDAMYCIN HCL 300 MG PO CAPS: 300.0000 mg | ORAL_CAPSULE | Freq: Two times a day (BID) | ORAL | 0 refills | Status: AC

## 2018-09-02 NOTE — Progress Notes (Signed)
Rx clindamycin for BV sx 

## 2019-01-17 DIAGNOSIS — R1013 Epigastric pain: Secondary | ICD-10-CM | POA: Diagnosis not present

## 2019-02-03 ENCOUNTER — Other Ambulatory Visit: Payer: Self-pay

## 2019-02-03 DIAGNOSIS — Z20822 Contact with and (suspected) exposure to covid-19: Secondary | ICD-10-CM

## 2019-02-03 DIAGNOSIS — Z20828 Contact with and (suspected) exposure to other viral communicable diseases: Secondary | ICD-10-CM | POA: Diagnosis not present

## 2019-02-04 LAB — NOVEL CORONAVIRUS, NAA: SARS-CoV-2, NAA: NOT DETECTED

## 2019-02-24 ENCOUNTER — Encounter: Payer: Self-pay | Admitting: Obstetrics and Gynecology

## 2019-02-24 ENCOUNTER — Ambulatory Visit (INDEPENDENT_AMBULATORY_CARE_PROVIDER_SITE_OTHER): Payer: 59 | Admitting: Obstetrics and Gynecology

## 2019-02-24 ENCOUNTER — Other Ambulatory Visit (HOSPITAL_COMMUNITY)
Admission: RE | Admit: 2019-02-24 | Discharge: 2019-02-24 | Disposition: A | Discharge status: Home / Self Care | Payer: 59 | Source: Ambulatory Visit | Attending: Obstetrics and Gynecology | Admitting: Obstetrics and Gynecology

## 2019-02-24 ENCOUNTER — Other Ambulatory Visit: Payer: Self-pay

## 2019-02-24 VITALS — BP 100/70 | Ht 63.0 in | Wt 161.0 lb

## 2019-02-24 DIAGNOSIS — Z9189 Other specified personal risk factors, not elsewhere classified: Secondary | ICD-10-CM

## 2019-02-24 DIAGNOSIS — Z30011 Encounter for initial prescription of contraceptive pills: Secondary | ICD-10-CM

## 2019-02-24 DIAGNOSIS — Z124 Encounter for screening for malignant neoplasm of cervix: Secondary | ICD-10-CM | POA: Insufficient documentation

## 2019-02-24 DIAGNOSIS — Z1151 Encounter for screening for human papillomavirus (HPV): Secondary | ICD-10-CM | POA: Insufficient documentation

## 2019-02-24 DIAGNOSIS — Z113 Encounter for screening for infections with a predominantly sexual mode of transmission: Secondary | ICD-10-CM

## 2019-02-24 DIAGNOSIS — Z1231 Encounter for screening mammogram for malignant neoplasm of breast: Secondary | ICD-10-CM

## 2019-02-24 DIAGNOSIS — Z803 Family history of malignant neoplasm of breast: Secondary | ICD-10-CM

## 2019-02-24 DIAGNOSIS — Z01419 Encounter for gynecological examination (general) (routine) without abnormal findings: Secondary | ICD-10-CM | POA: Diagnosis not present

## 2019-02-24 MED ORDER — MICROGESTIN 24 FE 1-20 MG-MCG PO TABS: 1.0000 | ORAL_TABLET | Freq: Every day | ORAL | 3 refills | Status: DC

## 2019-02-24 NOTE — Progress Notes (Signed)
PCP:  [name], MD   Chief Complaint  Patient presents with  . Gynecologic Exam     HPI:      [name] is a 28 y.o. G0P0000 who LMP was Patient's last menstrual period was 02/03/2019 (exact date)., presents today for her annual examination.  Her menses are regular every month, lasting 7-8 days.  Dysmenorrhea mild. She does not have intermenstrual bleeding.   Sex activity: new single partner, contraception - none. Did xulane in past. Would like to restart OCPs.   Last Pap: 02/20/18 Results were: no abnormalities  Hx of STDs: gonorrhea 10/19. Wants HIV testing. Hx of BV and yeast in past.  There is a FH of breast cancer in her pat aunt. There is a possible FH of ovarian cancer in her MGM. Pt is MyRisk neg except MSH3 VUS 2019. IBIS=21%. The patient does not do self-breast exams. Has not had mammo.  Tobacco use: The patient denies current or previous tobacco use. Alcohol use: social drinker No drug use.  Exercise: mod active  She does get adequate calcium but not enough Vitamin D in her diet.   Past Medical History:  Diagnosis Date  . BRCA negative 02/2018   MyRisk neg except MSH3 VUS  . BV (bacterial vaginosis)   . Family history of breast cancer   . Gonorrhea   . Increased risk of breast cancer 02/2018   IBIS=21%  . UTI (lower urinary tract infection)     Past Surgical History:  Procedure Laterality Date  . BREAST REDUCTION SURGERY  04/2016    Family History  Problem Relation Age of Onset  . Schizophrenia Mother   . Autism Brother   . Heart disease Maternal Grandfather   . Breast cancer Paternal Aunt 28  . Ovarian cancer Maternal Grandmother 52       "female cancer" ? primary site--extensive spread    Social History   Socioeconomic History  . Marital status: Single    Spouse name: Not on file  . Number of children: Not on file  . Years of education: Not on file  . Highest education level: Not on file  Occupational History   . Not on file  Social Needs  . Financial resource strain: Not on file  . Food insecurity    Worry: Not on file    Inability: Not on file  . Transportation needs    Medical: Not on file    Non-medical: Not on file  Tobacco Use  . Smoking status: Never Smoker  . Smokeless tobacco: Never Used  Substance and Sexual Activity  . Alcohol use: Yes    Comment: socially  . Drug use: No  . Sexual activity: Yes    Birth control/protection: None  Lifestyle  . Physical activity    Days per week: Not on file    Minutes per session: Not on file  . Stress: Not on file  Relationships  . Social Herbalist on phone: Not on file    Gets together: Not on file    Attends religious service: Not on file    Active member of club or organization: Not on file    Attends meetings of clubs or organizations: Not on file    Relationship status: Not on file  . Intimate partner violence    Fear of current or ex partner: Not on file    Emotionally abused: Not on file    Physically abused: Not on file  Forced sexual activity: Not on file  Other Topics Concern  . Not on file  Social History Narrative  . Not on file    Current Meds  Medication Sig  . cyclobenzaprine (FLEXERIL) 10 MG tablet Take 10 mg by mouth 3 (three) times daily as needed. for muscle spams     ROS:  Review of Systems  Constitutional: Negative for fatigue, fever and unexpected weight change.  Respiratory: Negative for cough, shortness of breath and wheezing.   Cardiovascular: Negative for chest pain, palpitations and leg swelling.  Gastrointestinal: Negative for blood in stool, constipation, diarrhea, nausea and vomiting.  Endocrine: Negative for cold intolerance, heat intolerance and polyuria.  Genitourinary: Negative for dyspareunia, dysuria, flank pain, frequency, genital sores, hematuria, menstrual problem, pelvic pain, urgency, vaginal bleeding, vaginal discharge and vaginal pain.  Musculoskeletal: Negative for  back pain, joint swelling and myalgias.  Skin: Negative for rash.  Neurological: Negative for dizziness, syncope, light-headedness, numbness and headaches.  Hematological: Negative for adenopathy.  Psychiatric/Behavioral: Negative for agitation, confusion, sleep disturbance and suicidal ideas. The patient is not nervous/anxious.      Objective: BP 100/70   Ht _0  (1.6 m)   Wt 161 lb (73 kg)   LMP 02/03/2019 (Exact Date)   BMI 28.52 kg/m    Physical Exam Constitutional:      Appearance: She is well-developed.  Genitourinary:     Vulva, vagina, right adnexa and left adnexa normal.     No vulval lesion or tenderness noted.     No vaginal discharge, erythema or tenderness.     No cervical motion tenderness or polyp.     Uterus is tender.     Uterus is not enlarged.     No right or left adnexal mass present.     Right adnexa not tender.     Left adnexa not tender.     Genitourinary Comments: ERYTHEMA BILAT LABIA MAJORA NEAR POST FOURCHETTE; NO LESIONS/FISSURES  Neck:     Musculoskeletal: Normal range of motion.     Thyroid: No thyromegaly.  Cardiovascular:     Rate and Rhythm: Normal rate and regular rhythm.     Heart sounds: Normal heart sounds. No murmur.  Pulmonary:     Effort: Pulmonary effort is normal.     Breath sounds: Normal breath sounds.  Chest:     Breasts:        Right: No mass, nipple discharge, skin change or tenderness.        Left: No mass, nipple discharge, skin change or tenderness.  Abdominal:     Palpations: Abdomen is soft.     Tenderness: There is no abdominal tenderness. There is no guarding.  Musculoskeletal: Normal range of motion.  Neurological:     General: No focal deficit present.     Mental Status: She is alert and oriented to person, place, and time.     Cranial Nerves: No cranial nerve deficit.  Skin:    General: Skin is warm and dry.  Psychiatric:        Mood and Affect: Mood normal.        Behavior: Behavior normal.         Thought Content: Thought content normal.        Judgment: Judgment normal.  Vitals signs reviewed.     Assessment/Plan: Encounter for annual routine gynecological examination  Cervical cancer screening - Plan: Cytology - PAP,  Screening for STD (sexually transmitted disease) - Plan: Cytology - PAP, HIV  Screening for HPV (human papillomavirus) - Plan: Cytology - PAP,   Encounter for initial prescription of contraceptive pills - Plan: Norethindrone Acetate-Ethinyl Estrad-FE (MICROGESTIN 24 FE) 1-20 MG-MCG(24) tablet; OCP start with next menses, condoms. Rx eRxd. F/u prn.   Encounter for screening mammogram for malignant neoplasm of breast - Plan: MM 3D SCREEN BREAST BILATERAL; pt to sched mammo  Family history of breast cancer - Plan: MM 3D SCREEN BREAST BILATERAL  Increased risk of breast cancer--MyRisk neg, IBIS=21%. Start yearly mammos age 64, yearly CBE, monthly SBE. Increase Vit D.   Meds ordered this encounter  Medications  . Norethindrone Acetate-Ethinyl Estrad-FE (MICROGESTIN 24 FE) 1-20 MG-MCG(24) tablet    Sig: Take 1 tablet by mouth daily.    Dispense:  84 tablet    Refill:  3    Order Specific Question:   Supervising Provider    Answer:   Gae Dry [[id number]]             GYN counsel adequate intake of calcium and vitamin D, diet and exercise     F/U  Return in about 1 year (around 02/24/2020).   B. , PA-C 02/24/2019 4:49 PM

## 2019-02-24 NOTE — Patient Instructions (Addendum)
I value your feedback and entrusting us with your care. If you get a [facility] patient survey, I would appreciate you taking the time to let us know about your experience today. Thank you!  [facility]: [phone]  [facility]: [phone]  

## 2019-02-28 LAB — CYTOLOGY - PAP
Chlamydia: NEGATIVE
Comment: NEGATIVE
Comment: NEGATIVE
Comment: NORMAL
Diagnosis: NEGATIVE
High risk HPV: POSITIVE — AB
Neisseria Gonorrhea: NEGATIVE

## 2019-03-03 ENCOUNTER — Encounter: Payer: Self-pay | Admitting: Obstetrics and Gynecology

## 2019-03-04 ENCOUNTER — Telehealth: Payer: Self-pay | Admitting: Obstetrics and Gynecology

## 2019-03-04 NOTE — Telephone Encounter (Signed)
UTR 

## 2019-03-04 NOTE — Telephone Encounter (Signed)
Patient is calling for labs results. Please advise. 

## 2019-03-05 NOTE — Telephone Encounter (Signed)
UTR 

## 2019-03-05 NOTE — Telephone Encounter (Signed)
Spoke with pt

## 2019-04-02 ENCOUNTER — Other Ambulatory Visit: Payer: 59

## 2019-04-02 ENCOUNTER — Other Ambulatory Visit: Payer: Self-pay | Admitting: Obstetrics and Gynecology

## 2019-04-02 ENCOUNTER — Other Ambulatory Visit: Payer: Self-pay

## 2019-04-02 DIAGNOSIS — Z03818 Encounter for observation for suspected exposure to other biological agents ruled out: Secondary | ICD-10-CM | POA: Diagnosis not present

## 2019-04-02 DIAGNOSIS — Z113 Encounter for screening for infections with a predominantly sexual mode of transmission: Secondary | ICD-10-CM | POA: Diagnosis not present

## 2019-04-03 LAB — HIV ANTIBODY (ROUTINE TESTING W REFLEX): HIV Screen 4th Generation wRfx: NONREACTIVE

## 2019-06-02 ENCOUNTER — Ambulatory Visit: Payer: BLUE CROSS/BLUE SHIELD | Attending: Internal Medicine

## 2019-06-02 ENCOUNTER — Other Ambulatory Visit: Payer: Self-pay | Admitting: Internal Medicine

## 2019-06-02 DIAGNOSIS — Z20822 Contact with and (suspected) exposure to covid-19: Secondary | ICD-10-CM

## 2019-06-03 LAB — NOVEL CORONAVIRUS, NAA: SARS-CoV-2, NAA: NOT DETECTED

## 2019-09-10 ENCOUNTER — Ambulatory Visit
Admission: EM | Admit: 2019-09-10 | Discharge: 2019-09-10 | Disposition: A | Discharge status: Home / Self Care | Payer: 59

## 2019-09-10 ENCOUNTER — Other Ambulatory Visit: Payer: Self-pay

## 2019-09-10 ENCOUNTER — Encounter: Payer: Self-pay | Admitting: Emergency Medicine

## 2019-09-10 DIAGNOSIS — R5383 Other fatigue: Secondary | ICD-10-CM

## 2019-09-10 DIAGNOSIS — R42 Dizziness and giddiness: Secondary | ICD-10-CM | POA: Diagnosis not present

## 2019-09-10 LAB — POCT URINALYSIS DIP (MANUAL ENTRY)
Bilirubin, UA: NEGATIVE
Blood, UA: NEGATIVE
Glucose, UA: NEGATIVE mg/dL
Ketones, POC UA: NEGATIVE mg/dL
Leukocytes, UA: NEGATIVE
Nitrite, UA: NEGATIVE
Protein Ur, POC: NEGATIVE mg/dL
Spec Grav, UA: <=1.005 — AB (ref 1.010–1.025)
Urobilinogen, UA: 0.2 E.U./dL
pH, UA: 6 (ref 5.0–8.0)

## 2019-09-10 LAB — POCT URINE PREGNANCY: Preg Test, Ur: NEGATIVE

## 2019-09-10 LAB — POCT FASTING CBG KUC MANUAL ENTRY: POCT Glucose (KUC): 92 mg/dL (ref 70–99)

## 2019-09-10 NOTE — ED Triage Notes (Addendum)
Pt c/o weakness, fatigue and lightheaded. She states it started about 2 weeks ago. She states she went out and felt hung over the next day. She has been moving and has been running a lot. She states she gets lightheaded randomly and has occurred while she was working out. She also states she has had some intermittent aching in her flank area but she states she did a back work out yesterday and has been moving. Pt also states she took a plan B about a week and a half ago.

## 2019-09-10 NOTE — Discharge Instructions (Addendum)
Your labwork is pending.  I will call you with the results tomorrow.    Your EKG is normal.    Your urine does not show signs of infection.  Your pregnancy test is negative.    Change positions slowly if you are feeling lightheaded.  Hold on something until you feel steady.    Call your primary care provider to schedule an appointment for next week for a follow-up.  Follow-up sooner if your symptoms are not improving.    Your Covid test is pending.  You should self quarantine until the test result is back.    Go to the emergency department if you have acute worsening symptoms.

## 2019-09-10 NOTE — ED Provider Notes (Signed)
Traci Flowers    CSN: [id number] Arrival date & time: 09/10/19  [id number]      History   Chief Complaint Chief Complaint  Patient presents with  . Fatigue    HPI Traci Flowers is a 29 y.o. female.   Patient presents with fatigue, generalized weakness, and lightheadedness x2 weeks.  She states 2 weeks ago she had a night of "drinking"; took Plan B, and has been moving.  She states the lightheadedness is intermittent and worse when she is exercising or changes positions.  The fatigue and generalized weakness are constant.  Patient also reports bilateral flank pain but has been exercising her back muscles and thinks it is from this.  She denies fever, chills, sore throat, chest pain, cough, shortness of breath, abdominal pain, dysuria, vaginal discharge, pelvic pain, or other symptoms.  She has increased her water intake to treat her symptoms.    The history is provided by the patient.    Past Medical History:  Diagnosis Date  . BRCA negative 02/2018   MyRisk neg except MSH3 VUS  . BV (bacterial vaginosis)   . Family history of breast cancer   . Gonorrhea   . Increased risk of breast cancer 02/2018   IBIS=21%  . UTI (lower urinary tract infection)     Patient Active Problem List   Diagnosis Date Noted  . Family history of breast cancer 02/24/2019  . Increased risk of breast cancer 03/12/2018  . Gonorrhea 02/20/2018    Past Surgical History:  Procedure Laterality Date  . BREAST REDUCTION SURGERY  04/2016    OB History    Gravida  0   Para  0   Term  0   Preterm  0   AB  0   Living  0     SAB  0   TAB  0   Ectopic  0   Multiple  0   Live Births  0            Home Medications    Prior to Admission medications   Medication Sig Start Date End Date Taking? Authorizing Provider  Multiple Vitamin (MULTIVITAMIN) tablet Take 1 tablet by mouth daily.   Yes [provider]  Vitamin D, Cholecalciferol, 50 MCG (2000 UT) CAPS Take  by mouth.   Yes [provider]  cyclobenzaprine (FLEXERIL) 10 MG tablet Take 10 mg by mouth 3 (three) times daily as needed. for muscle spams 10/20/18   [provider]  Norethindrone Acetate-Ethinyl Estrad-FE (MICROGESTIN 24 FE) 1-20 MG-MCG(24) tablet Take 1 tablet by mouth daily. 38/7/56   Copland, Deirdre Evener, PA-C    Family History Family History  Problem Relation Age of Onset  . Schizophrenia Mother   . Autism Brother   . Heart disease Maternal Grandfather   . Breast cancer Paternal Aunt 64  . Ovarian cancer Maternal Grandmother 78       "female cancer" ? primary site--extensive spread    Social History Social History   Tobacco Use  . Smoking status: Never Smoker  . Smokeless tobacco: Never Used  Substance Use Topics  . Alcohol use: Yes    Comment: socially  . Drug use: No     Allergies   Patient has no known allergies.   Review of Systems Review of Systems  Constitutional: Positive for fatigue. Negative for chills and fever.  HENT: Negative for congestion, ear pain, rhinorrhea and sore throat.   Eyes: Negative for pain and visual disturbance.  Respiratory: Negative for cough and shortness of breath.   Cardiovascular: Negative for chest pain and palpitations.  Gastrointestinal: Negative for abdominal pain, diarrhea, nausea and vomiting.  Genitourinary: Positive for flank pain. Negative for dysuria and hematuria.  Musculoskeletal: Negative for arthralgias and back pain.  Skin: Negative for color change and rash.  Neurological: Positive for weakness and light-headedness. Negative for seizures and syncope.  All other systems reviewed and are negative.    Physical Exam Triage Vital Signs ED Triage Vitals  Enc Vitals Group     BP      Pulse      Resp      Temp      Temp src      SpO2      Weight      Height      Head Circumference      Peak Flow      Pain Score      Pain Loc      Pain Edu?      Excl. in [place]?    Orthostatic VS for the  past 24 hrs:  BP- Lying Pulse- Lying BP- Sitting Pulse- Sitting BP- Standing at 0 minutes Pulse- Standing at 0 minutes  09/10/19 0936 104/67 64 114/76 79 114/76 79    Updated Vital Signs BP 110/72 (BP Location: Left Arm)   Pulse 60   Temp 97.9 F (36.6 C) (Oral)   Resp 18   Ht '5\' 3"'$  (1.6 m)   Wt 160 lb 15 oz (73 kg)   LMP 08/20/2019   SpO2 98%   BMI 28.51 kg/m   Visual Acuity Right Eye Distance:   Left Eye Distance:   Bilateral Distance:    Right Eye Near:   Left Eye Near:    Bilateral Near:     Physical Exam Vitals and nursing note reviewed.  Constitutional:      General: She is not in acute distress.    Appearance: She is well-developed. She is not ill-appearing.  HENT:     Head: Normocephalic and atraumatic.     Right Ear: Tympanic membrane normal.     Left Ear: Tympanic membrane normal.     Nose: Nose normal.     Mouth/Throat:     Mouth: Mucous membranes are moist.     Pharynx: Oropharynx is clear.  Eyes:     Conjunctiva/sclera: Conjunctivae normal.  Cardiovascular:     Rate and Rhythm: Normal rate and regular rhythm.     Heart sounds: Normal heart sounds. No murmur.  Pulmonary:     Effort: Pulmonary effort is normal. No respiratory distress.     Breath sounds: Normal breath sounds.  Abdominal:     General: Bowel sounds are normal.     Palpations: Abdomen is soft.     Tenderness: There is no abdominal tenderness. There is no right CVA tenderness, left CVA tenderness, guarding or rebound.  Musculoskeletal:     Cervical back: Neck supple.     Right lower leg: No edema.     Left lower leg: No edema.  Skin:    General: Skin is warm and dry.     Findings: No bruising, erythema or rash.  Neurological:     General: No focal deficit present.     Mental Status: She is alert and oriented to person, place, and time.     Cranial Nerves: No cranial nerve deficit.     Sensory: No sensory deficit.     Motor: No weakness.  Coordination: Coordination normal.      Gait: Gait normal.  Psychiatric:        Mood and Affect: Mood normal.        Behavior: Behavior normal.      UC Treatments / Results  Labs (all labs ordered are listed, but only abnormal results are displayed) Labs Reviewed  POCT URINALYSIS DIP (MANUAL ENTRY) - Abnormal; Notable for the following components:      Result Value   Color, UA light yellow (*)    Spec Grav, UA <=1.005 (*)    All other components within normal limits  NOVEL CORONAVIRUS, NAA  CBC  COMPREHENSIVE METABOLIC PANEL  POCT URINE PREGNANCY  POCT FASTING CBG KUC MANUAL ENTRY    EKG   Radiology No results found.  Procedures Procedures (including critical care time)  Medications Ordered in UC Medications - No data to display  Initial Impression / Assessment and Plan / UC Course  I have reviewed the triage vital signs and the nursing notes.  Pertinent labs & imaging results that were available during my care of the patient were reviewed by me and considered in my medical decision making (see chart for details).   Light-headedness, Fatigue.  EKG shows sinus rhythm, rate 60, no ST elevation, no previous to compare.  Urine pregnancy negative; urine does not show signs of infection.  CBC and CMP pending.  PCR COVID pending.  Instructed patient to self quarantine until her Covid test result is back.  Instructed her to follow-up with her PCP in 1 week for a recheck.  Education provided about changing positions slowly and ensuring stability before ambulation.  Education provided about normal daily hydration with water.  Instructed her to go to the ED if she has acute worsening symptoms.  Patient agrees to plan of care.       Final Clinical Impressions(s) / UC Diagnoses   Final diagnoses:  Light headedness  Fatigue, unspecified type     Discharge Instructions     Your labwork is pending.  I will call you with the results tomorrow.    Your EKG is normal.    Your urine does not show signs of infection.   Your pregnancy test is negative.    Change positions slowly if you are feeling lightheaded.  Hold on something until you feel steady.    Call your primary care provider to schedule an appointment for next week for a follow-up.  Follow-up sooner if your symptoms are not improving.    Your Covid test is pending.  You should self quarantine until the test result is back.    Go to the emergency department if you have acute worsening symptoms.          ED Prescriptions    None     PDMP not reviewed this encounter.   Sharion Balloon, NP 09/10/19 [phone]

## 2019-09-11 LAB — COMPREHENSIVE METABOLIC PANEL
ALT: 9 IU/L (ref 0–32)
AST: 25 IU/L (ref 0–40)
Albumin/Globulin Ratio: 1.9 (ref 1.2–2.2)
Albumin: 4.2 g/dL (ref 3.9–5.0)
Alkaline Phosphatase: 49 IU/L (ref 48–121)
BUN/Creatinine Ratio: 18 (ref 9–23)
BUN: 14 mg/dL (ref 6–20)
Bilirubin Total: <0.2 mg/dL (ref 0.0–1.2)
CO2: 23 mmol/L (ref 20–29)
Calcium: 8.6 mg/dL — ABNORMAL LOW (ref 8.7–10.2)
Chloride: 102 mmol/L (ref 96–106)
Creatinine, Ser: 0.8 mg/dL (ref 0.57–1.00)
GFR calc Af Amer: 114 mL/min/{1.73_m2} (ref >59)
GFR calc non Af Amer: 99 mL/min/{1.73_m2} (ref >59)
Globulin, Total: 2.2 g/dL (ref 1.5–4.5)
Glucose: 86 mg/dL (ref 65–99)
Potassium: 3.6 mmol/L (ref 3.5–5.2)
Sodium: 137 mmol/L (ref 134–144)
Total Protein: 6.4 g/dL (ref 6.0–8.5)

## 2019-09-11 LAB — CBC
Hematocrit: 36.3 % (ref 34.0–46.6)
Hemoglobin: 11.7 g/dL (ref 11.1–15.9)
MCH: 27.7 pg (ref 26.6–33.0)
MCHC: 32.2 g/dL (ref 31.5–35.7)
MCV: 86 fL (ref 79–97)
Platelets: 263 10*3/uL (ref 150–450)
RBC: 4.22 x10E6/uL (ref 3.77–5.28)
RDW: 12.3 % (ref 11.7–15.4)
WBC: 7.5 10*3/uL (ref 3.4–10.8)

## 2019-09-11 LAB — NOVEL CORONAVIRUS, NAA: SARS-CoV-2, NAA: NOT DETECTED

## 2019-09-11 LAB — SARS-COV-2, NAA 2 DAY TAT

## 2019-09-12 ENCOUNTER — Telehealth: Payer: Self-pay | Admitting: Emergency Medicine

## 2019-09-12 NOTE — Telephone Encounter (Signed)
Called patient to review labs. No answer and unable to leave message.

## 2019-09-30 ENCOUNTER — Encounter: Payer: Self-pay | Admitting: Obstetrics and Gynecology

## 2019-09-30 DIAGNOSIS — Z30011 Encounter for initial prescription of contraceptive pills: Secondary | ICD-10-CM

## 2019-09-30 MED ORDER — MICROGESTIN 24 FE 1-20 MG-MCG PO TABS: 1.0000 | ORAL_TABLET | Freq: Every day | ORAL | 1 refills | Status: DC

## 2019-09-30 NOTE — Telephone Encounter (Signed)
Called [facility] in [place] to cancel Rx. Rx sent to new pharmacy per pt's request. Pt aware via mychart msg.

## 2019-10-06 NOTE — Progress Notes (Signed)
Traci Hartigan, MD   Chief Complaint  Patient presents with  . Vaginal Discharge    itchiness, no odor or irritation x 5 days    HPI:      [name] is a 29 y.o. G0P0000 whose LMP was Patient's last menstrual period was 09/16/2019 (exact date)., presents today for increased d/c with irritation, no odor for the past 5 days. Treated with boric acid a few days ago and coconut oil with some improvement. Had yeast vag sx about a month ago, treated with monistat-3 with sx relief. Concerned about d/c. No prior abx use, no new soaps/detergents. She is sex active with new partner. Hx of BV and yeast in past. No LBP, pelvic pain, fevers, urin sx.   Past Medical History:  Diagnosis Date  . BRCA negative 02/2018   MyRisk neg except MSH3 VUS  . BV (bacterial vaginosis)   . Family history of breast cancer   . Gonorrhea   . Increased risk of breast cancer 02/2018   IBIS=21%  . UTI (lower urinary tract infection)     Past Surgical History:  Procedure Laterality Date  . BREAST REDUCTION SURGERY  04/2016    Family History  Problem Relation Age of Onset  . Schizophrenia Mother   . Autism Brother   . Heart disease Maternal Grandfather   . Breast cancer Paternal Aunt 22  . Ovarian cancer Maternal Grandmother 75       "female cancer" ? primary site--extensive spread    Social History   Socioeconomic History  . Marital status: Single    Spouse name: Not on file  . Number of children: Not on file  . Years of education: Not on file  . Highest education level: Not on file  Occupational History  . Not on file  Tobacco Use  . Smoking status: Never Smoker  . Smokeless tobacco: Never Used  Vaping Use  . Vaping Use: Never used  Substance and Sexual Activity  . Alcohol use: Yes    Comment: socially  . Drug use: No  . Sexual activity: Yes    Birth control/protection: None  Other Topics Concern  . Not on file  Social History Narrative  . Not on file   Social  Determinants of Health   Financial Resource Strain:   . Difficulty of Paying Living Expenses:   Food Insecurity:   . Worried About Charity fundraiser in the Last Year:   . Arboriculturist in the Last Year:   Transportation Needs:   . Film/video editor (Medical):   Marland Kitchen Lack of Transportation (Non-Medical):   Physical Activity:   . Days of Exercise per Week:   . Minutes of Exercise per Session:   Stress:   . Feeling of Stress :   Social Connections:   . Frequency of Communication with Friends and Family:   . Frequency of Social Gatherings with Friends and Family:   . Attends Religious Services:   . Active Member of Clubs or Organizations:   . Attends Archivist Meetings:   Marland Kitchen Marital Status:   Intimate Partner Violence:   . Fear of Current or Ex-Partner:   . Emotionally Abused:   Marland Kitchen Physically Abused:   . Sexually Abused:     Outpatient Medications Prior to Visit  Medication Sig Dispense Refill  . cyclobenzaprine (FLEXERIL) 10 MG tablet Take 10 mg by mouth 3 (three) times daily as needed. for muscle spams    .  Multiple Vitamin (MULTIVITAMIN) tablet Take 1 tablet by mouth daily.    . Vitamin D, Cholecalciferol, 50 MCG (2000 UT) CAPS Take by mouth.    . Norethindrone Acetate-Ethinyl Estrad-FE (MICROGESTIN 24 FE) 1-20 MG-MCG(24) tablet Take 1 tablet by mouth daily. (Patient not taking: Reported on 10/07/2019) 84 tablet 1   No facility-administered medications prior to visit.      ROS:  Review of Systems  Constitutional: Negative for fever.  Gastrointestinal: Negative for blood in stool, constipation, diarrhea, nausea and vomiting.  Genitourinary: Positive for vaginal discharge. Negative for dyspareunia, dysuria, flank pain, frequency, hematuria, urgency, vaginal bleeding and vaginal pain.  Musculoskeletal: Negative for back pain.  Skin: Negative for rash.   BREAST: No symptoms   OBJECTIVE:   Vitals:  BP 100/68   Ht 5' 3" (1.6 m)   Wt 158 lb (71.7 kg)    LMP 09/16/2019 (Exact Date)   BMI 27.99 kg/m   Physical Exam Vitals reviewed.  Constitutional:      Appearance: She is well-developed.  Pulmonary:     Effort: Pulmonary effort is normal.  Genitourinary:    General: Normal vulva.     Pubic Area: No rash.      Labia:        Right: No rash, tenderness or lesion.        Left: No rash, tenderness or lesion.      Vagina: Vaginal discharge present. No erythema or tenderness.     Cervix: Normal.     Uterus: Normal. Not enlarged and not tender.      Adnexa: Right adnexa normal and left adnexa normal.       Right: No mass or tenderness.         Left: No mass or tenderness.    Musculoskeletal:        General: Normal range of motion.     Cervical back: Normal range of motion.  Skin:    General: Skin is warm and dry.  Neurological:     General: No focal deficit present.     Mental Status: She is alert and oriented to person, place, and time.  Psychiatric:        Mood and Affect: Mood normal.        Behavior: Behavior normal.        Thought Content: Thought content normal.        Judgment: Judgment normal.     Results: Results for orders placed or performed in visit on 10/07/19 (from the past 24 hour(s))  POCT Wet Prep with KOH     Status: Normal   Collection Time: 10/07/19 10:39 AM  Result Value Ref Range   Trichomonas, UA Negative    Clue Cells Wet Prep HPF POC neg    Epithelial Wet Prep HPF POC     Yeast Wet Prep HPF POC neg    Bacteria Wet Prep HPF POC     RBC Wet Prep HPF POC     WBC Wet Prep HPF POC     KOH Prep POC Negative Negative     Assessment/Plan: Vaginal discharge - Plan: Cervicovaginal ancillary only, POCT Wet Prep with KOH; neg wet prep, neg exam. Check STD/yeast on swab. Will f/u with results.   Screening for STD (sexually transmitted disease) - Plan: Cervicovaginal ancillary only    Return if symptoms worsen or fail to improve.  Alicia B. Copland, PA-C 10/07/2019 10:40 AM      

## 2019-10-07 ENCOUNTER — Other Ambulatory Visit: Payer: Self-pay

## 2019-10-07 ENCOUNTER — Encounter: Payer: Self-pay | Admitting: Obstetrics and Gynecology

## 2019-10-07 ENCOUNTER — Other Ambulatory Visit (HOSPITAL_COMMUNITY)
Admission: RE | Admit: 2019-10-07 | Discharge: 2019-10-07 | Disposition: A | Discharge status: Home / Self Care | Payer: 59 | Source: Ambulatory Visit | Attending: Obstetrics and Gynecology | Admitting: Obstetrics and Gynecology

## 2019-10-07 ENCOUNTER — Ambulatory Visit (INDEPENDENT_AMBULATORY_CARE_PROVIDER_SITE_OTHER): Payer: 59 | Admitting: Obstetrics and Gynecology

## 2019-10-07 VITALS — BP 100/68 | Ht 63.0 in | Wt 158.0 lb

## 2019-10-07 DIAGNOSIS — N898 Other specified noninflammatory disorders of vagina: Secondary | ICD-10-CM

## 2019-10-07 DIAGNOSIS — Z113 Encounter for screening for infections with a predominantly sexual mode of transmission: Secondary | ICD-10-CM

## 2019-10-07 LAB — POCT WET PREP WITH KOH
Clue Cells Wet Prep HPF POC: NEGATIVE
KOH Prep POC: NEGATIVE
Trichomonas, UA: NEGATIVE
Yeast Wet Prep HPF POC: NEGATIVE

## 2019-10-07 NOTE — Patient Instructions (Signed)
I value your feedback and entrusting us with your care. If you get a [facility] patient survey, I would appreciate you taking the time to let us know about your experience today. Thank you!  As of April 03, 2019, your lab results will be released to your MyChart immediately, before I even have a chance to see them. Please give me time to review them and contact you if there are any abnormalities. Thank you for your patience.  

## 2019-10-08 LAB — CERVICOVAGINAL ANCILLARY ONLY
Candida Glabrata: NEGATIVE
Candida Vaginitis: POSITIVE — AB
Chlamydia: NEGATIVE
Comment: NEGATIVE
Comment: NEGATIVE
Comment: NEGATIVE
Comment: NEGATIVE
Comment: NORMAL
Neisseria Gonorrhea: NEGATIVE
Trichomonas: NEGATIVE

## 2019-10-08 MED ORDER — FLUCONAZOLE 150 MG PO TABS
150.0000 mg | ORAL_TABLET | Freq: Once | ORAL | 0 refills | Status: AC
Start: 2019-10-08 — End: 2019-10-08

## 2019-10-08 NOTE — Addendum Note (Signed)
Addended by: Althea Grimmer B on: 10/08/2019 02:34 PM   Modules accepted: Orders

## 2019-11-22 DIAGNOSIS — Z20822 Contact with and (suspected) exposure to covid-19: Secondary | ICD-10-CM | POA: Diagnosis not present

## 2020-02-20 ENCOUNTER — Other Ambulatory Visit (HOSPITAL_COMMUNITY)
Admission: RE | Admit: 2020-02-20 | Discharge: 2020-02-20 | Disposition: A | Discharge status: Home / Self Care | Payer: 59 | Source: Ambulatory Visit | Attending: Obstetrics and Gynecology | Admitting: Obstetrics and Gynecology

## 2020-02-20 ENCOUNTER — Other Ambulatory Visit: Payer: Self-pay

## 2020-02-20 ENCOUNTER — Ambulatory Visit (INDEPENDENT_AMBULATORY_CARE_PROVIDER_SITE_OTHER): Payer: 59

## 2020-02-20 ENCOUNTER — Encounter: Payer: Self-pay | Admitting: Obstetrics and Gynecology

## 2020-02-20 ENCOUNTER — Ambulatory Visit (INDEPENDENT_AMBULATORY_CARE_PROVIDER_SITE_OTHER): Payer: 59 | Admitting: Obstetrics and Gynecology

## 2020-02-20 VITALS — BP 100/70 | Ht 63.0 in | Wt 161.6 lb

## 2020-02-20 DIAGNOSIS — B373 Candidiasis of vulva and vagina: Secondary | ICD-10-CM

## 2020-02-20 DIAGNOSIS — Z124 Encounter for screening for malignant neoplasm of cervix: Secondary | ICD-10-CM

## 2020-02-20 DIAGNOSIS — B3731 Acute candidiasis of vulva and vagina: Secondary | ICD-10-CM

## 2020-02-20 DIAGNOSIS — R102 Pelvic and perineal pain: Secondary | ICD-10-CM

## 2020-02-20 DIAGNOSIS — R87610 Atypical squamous cells of undetermined significance on cytologic smear of cervix (ASC-US): Secondary | ICD-10-CM | POA: Diagnosis not present

## 2020-02-20 LAB — POCT URINE PREGNANCY: Preg Test, Ur: NEGATIVE

## 2020-02-20 MED ORDER — FLUCONAZOLE 150 MG PO TABS: 150.0000 mg | ORAL_TABLET | ORAL | 0 refills | Status: AC

## 2020-02-20 NOTE — Progress Notes (Signed)
Patient ID: Traci Flowers, female   DOB: [birth year], 29 y.o.   MRN: [id number]  Reason for Consult: Gynecologic Exam   Referred by [name], MD  Subjective:     HPI:  Traci Flowers is a 29 y.o. female she presents today with complaints of burning sharp pelvic pain.  She reports that she feels the pain mostly on her right and sometimes her left.  She reports that that the pain comes and goes and has been doing so for the last 3 weeks.  She says that she feels the pain multiple times a day but generally last for few minutes and then resolves.  She reports that she felt a similar pain when she was in college and had a ruptured ovarian cyst.  She denies any vaginal discharge.  She denies any pain with intercourse.  She reports that has been 2 months since her last intercourse.  She denies any nausea vomiting.  She denies any constipation or diarrhea.  She reports that she generally uses withdrawal and condoms for intercourse.  2 months ago she took a Plan B pill.  Gynecological History Menarche: 13 LMP: 01/19/2020 Describes periods as monthly.  2 to 3 days of heavy bleeding and 4 days of spotting afterwards.  Total length of 6 to 7 days.  No history of heavy bleeding mild cramps. Last pap smear: NIL, HPV + 2020 Sexually Active: yes  Past Medical History:  Diagnosis Date  . BRCA negative 02/2018   MyRisk neg except MSH3 VUS  . BV (bacterial vaginosis)   . Family history of breast cancer   . Gonorrhea   . Increased risk of breast cancer 02/2018   IBIS=21%  . UTI (lower urinary tract infection)    Family History  Problem Relation Age of Onset  . Schizophrenia Mother   . Autism Brother   . Heart disease Maternal Grandfather   . Breast cancer Paternal Aunt 26  . Ovarian cancer Maternal Grandmother 67       "female cancer" ? primary site--extensive spread   Past Surgical History:  Procedure Laterality Date  . BREAST REDUCTION SURGERY  04/2016    Short  Social History:  Social History   Tobacco Use  . Smoking status: Never Smoker  . Smokeless tobacco: Never Used  Substance Use Topics  . Alcohol use: Yes    Comment: socially    No Known Allergies  Current Outpatient Medications  Medication Sig Dispense Refill  . cyclobenzaprine (FLEXERIL) 10 MG tablet Take 10 mg by mouth 3 (three) times daily as needed. for muscle spams    . Multiple Vitamin (MULTIVITAMIN) tablet Take 1 tablet by mouth daily.    . Vitamin D, Cholecalciferol, 50 MCG (2000 UT) CAPS Take by mouth.    . Norethindrone Acetate-Ethinyl Estrad-FE (MICROGESTIN 24 FE) 1-20 MG-MCG(24) tablet Take 1 tablet by mouth daily. (Patient not taking: Reported on 02/20/2020) 84 tablet 1   No current facility-administered medications for this visit.    Review of Systems  Constitutional: Negative for chills, fatigue, fever and unexpected weight change.  HENT: Negative for trouble swallowing.  Eyes: Negative for loss of vision.  Respiratory: Negative for cough, shortness of breath and wheezing.  Cardiovascular: Negative for chest pain, leg swelling, palpitations and syncope.  GI: Negative for abdominal pain, blood in stool, diarrhea, nausea and vomiting.  GU: Negative for difficulty urinating, dysuria, frequency and hematuria.  Musculoskeletal: Negative for back pain, leg pain and joint pain.  Skin: Negative  for rash.  Neurological: Negative for dizziness, headaches, light-headedness, numbness and seizures.  Psychiatric: Negative for behavioral problem, confusion, depressed mood and sleep disturbance.        Objective:  Objective   Vitals:   02/20/20 0936  BP: 100/70  Weight: 161 lb 9.6 oz (73.3 kg)  Height: _0  (1.6 m)   Body mass index is 28.63 kg/m.  Physical Exam Vitals and nursing note reviewed.  Constitutional:      Appearance: She is well-developed.  HENT:     Head: Normocephalic and atraumatic.  Eyes:     Pupils: Pupils are equal, round, and reactive to  light.  Cardiovascular:     Rate and Rhythm: Normal rate and regular rhythm.  Pulmonary:     Effort: Pulmonary effort is normal. No respiratory distress.  Genitourinary:    Comments: External: Vulva normal. No lesions noted.  Speculum examination:  Cervix deviated , pointing to the right . No blood in the vaginal vault. Thick white, curdy discharge.  Bimanual examination: Uterus midlne, non-tender, normal in size, shape and contour.  No CMT. Adnexa right side enlarged. Pelvis not fixed.  Skin:    General: Skin is warm and dry.  Neurological:     Mental Status: She is alert and oriented to person, place, and time.  Psychiatric:        Behavior: Behavior normal.        Thought Content: Thought content normal.        Judgment: Judgment normal.        Assessment/Plan:     29 year old G0 P0 with pelvic pain. 1.  She will follow-up this afternoon for pelvic ultrasound.  Suspect right ovarian cyst. 2.  Pregnancy test negative 3.  Yeast vaginitis we will treat with Diflucan 4.  History of an [place] HPV positive Pap smear last year.  Pap smear repeated today.  We will follow-up this afternoon with patient after ultrasound. More than 30 minutes were spent face to face with the patient in the room, reviewing the medical record, labs and images, and coordinating care for the patient. The plan of management was discussed in detail and counseling was provided.    Adrian Prows MD [facility], [facility] 02/20/2020 9:54 AM

## 2020-02-20 NOTE — Progress Notes (Signed)
Pt states she has been having pelvis pain x 3 weeks off and on.

## 2020-02-20 NOTE — Addendum Note (Signed)
Addended by: Clement Husbands A on: 02/20/2020 10:20 AM   Modules accepted: Orders

## 2020-02-26 LAB — CYTOLOGY - PAP
Chlamydia: NEGATIVE
Comment: NEGATIVE
Comment: NEGATIVE
Comment: NEGATIVE
Comment: NEGATIVE
Comment: NORMAL
Diagnosis: UNDETERMINED — AB
HPV 16: NEGATIVE
HPV 18 / 45: NEGATIVE
High risk HPV: POSITIVE — AB
Neisseria Gonorrhea: NEGATIVE
Trichomonas: NEGATIVE

## 2020-02-27 ENCOUNTER — Encounter: Payer: Self-pay | Admitting: Obstetrics and Gynecology

## 2020-02-27 ENCOUNTER — Telehealth: Payer: Self-pay | Admitting: Obstetrics and Gynecology

## 2020-02-27 ENCOUNTER — Other Ambulatory Visit: Payer: Self-pay | Admitting: Obstetrics and Gynecology

## 2020-02-27 DIAGNOSIS — B3731 Acute candidiasis of vulva and vagina: Secondary | ICD-10-CM

## 2020-02-27 DIAGNOSIS — B373 Candidiasis of vulva and vagina: Secondary | ICD-10-CM

## 2020-02-27 MED ORDER — FLUCONAZOLE 150 MG PO TABS: 150.0000 mg | ORAL_TABLET | ORAL | 0 refills | Status: AC

## 2020-02-27 NOTE — Telephone Encounter (Signed)
Patient is calling back for results. Please advise ?

## 2020-02-27 NOTE — Telephone Encounter (Signed)
Patient needs to schedule a colposcopy

## 2020-02-27 NOTE — Telephone Encounter (Signed)
Patient is scheduled for 03/26/20 with [facility]  

## 2020-02-27 NOTE — Telephone Encounter (Signed)
Called and left voicemail for patient to call back to be scheduled. 

## 2020-03-01 ENCOUNTER — Other Ambulatory Visit: Payer: Self-pay | Admitting: Obstetrics and Gynecology

## 2020-03-01 MED ORDER — XULANE 150-35 MCG/24HR TD PTWK: 1.0000 | MEDICATED_PATCH | TRANSDERMAL | 3 refills | Status: DC

## 2020-03-01 NOTE — Progress Notes (Signed)
Rx xulane for [facility]. Last pap 11/21.

## 2020-03-26 ENCOUNTER — Encounter: Payer: Self-pay | Admitting: Obstetrics and Gynecology

## 2020-03-26 ENCOUNTER — Ambulatory Visit (INDEPENDENT_AMBULATORY_CARE_PROVIDER_SITE_OTHER): Payer: 59 | Admitting: Obstetrics and Gynecology

## 2020-03-26 ENCOUNTER — Other Ambulatory Visit (HOSPITAL_COMMUNITY)
Admission: RE | Admit: 2020-03-26 | Discharge: 2020-03-26 | Disposition: A | Discharge status: Home / Self Care | Payer: 59 | Source: Ambulatory Visit | Attending: Obstetrics and Gynecology | Admitting: Obstetrics and Gynecology

## 2020-03-26 ENCOUNTER — Other Ambulatory Visit: Payer: Self-pay

## 2020-03-26 VITALS — BP 114/70 | Ht 63.0 in | Wt 165.0 lb

## 2020-03-26 DIAGNOSIS — N870 Mild cervical dysplasia: Secondary | ICD-10-CM | POA: Diagnosis not present

## 2020-03-26 DIAGNOSIS — R87610 Atypical squamous cells of undetermined significance on cytologic smear of cervix (ASC-US): Secondary | ICD-10-CM

## 2020-03-26 DIAGNOSIS — Z113 Encounter for screening for infections with a predominantly sexual mode of transmission: Secondary | ICD-10-CM | POA: Diagnosis not present

## 2020-03-26 NOTE — Patient Instructions (Signed)
Colposcopy, Care After This sheet gives you information about how to care for yourself after your procedure. Your doctor may also give you more specific instructions. If you have problems or questions, contact your doctor. What can I expect after the procedure? If you did not have a tissue sample removed (did not have a biopsy), you may only have some spotting for a few days. You can go back to your normal activities. If you had a tissue sample removed, it is common to have:  Soreness and pain. This may last for a few days.  Light-headedness.  Mild bleeding from your vagina or dark-colored, grainy discharge from your vagina. This may last for a few days. You may need to wear a sanitary pad.  Spotting for at least 48 hours after the procedure. Follow these instructions at home:   Take over-the-counter and prescription medicines only as told by your doctor. Ask your doctor what medicines you can start taking again. This is very important if you take blood-thinning medicine.  Do not drive or use heavy machinery while taking prescription pain medicine.  For 3 days, or as long as your doctor tells you, avoid: ? Douching. ? Using tampons. ? Having sex.  If you use birth control (contraception), keep using it.  Limit activity for the first day after the procedure. Ask your doctor what activities are safe for you.  It is up to you to get the results of your procedure. Ask your doctor when your results will be ready.  Keep all follow-up visits as told by your doctor. This is important. Contact a doctor if:  You get a skin rash. Get help right away if:  You are bleeding a lot from your vagina. It is a lot of bleeding if you are using more than one pad an hour for 2 hours in a row.  You have clumps of blood (blood clots) coming from your vagina.  You have a fever.  You have chills  You have pain in your lower belly (pelvic area).  You have signs of infection, such as vaginal  discharge that is: ? Different than usual. ? Yellow. ? Bad-smelling.  You have very pain or cramps in your lower belly that do not get better with medicine.  You feel light-headed.  You feel dizzy.  You pass out (faint). Summary  If you did not have a tissue sample removed (did not have a biopsy), you may only have some spotting for a few days. You can go back to your normal activities.  If you had a tissue sample removed, it is common to have mild pain and spotting for 48 hours.  For 3 days, or as long as your doctor tells you, avoid douching, using tampons and having sex.  Get help right away if you have bleeding, very bad pain, or signs of infection. This information is not intended to replace advice given to you by your health care provider. Make sure you discuss any questions you have with your health care provider. Document Revised: 03/23/2017 Document Reviewed: 12/29/2015 Elsevier Patient Education  2020 Elsevier Inc.  

## 2020-03-26 NOTE — Progress Notes (Signed)
   GYNECOLOGY CLINIC COLPOSCOPY PROCEDURE NOTE  29 y.o. G0P0000 here for colposcopy for ASCUS with POSITIVE high risk HPV  pap smear on 02/20/2020. Discussed underlying role for HPV infection in the development of cervical dysplasia, its natural history and progression/regression, need for surveillance.  Is the patient  pregnant: No LMP: Patient's last menstrual period was 03/05/2020. Smoking status:  reports that she has never smoked. She has never used smokeless tobacco. Contraception: xulane Future fertility desired:  Yes  Patient given informed consent, signed copy in the chart, time out was performed.  The patient was position in dorsal lithotomy position. Speculum was placed the cervix was visualized.   After application of acetic acid colposcopic inspection of the cervix was undertaken.   Colposcopy adequate, full visualization of transformation zone: Yes acetowhite lesion(s) noted at 9 o'clock; corresponding biopsies obtained.   ECC specimen obtained:  Yes  All specimens were labeled and sent to pathology.   Patient was given post procedure instructions.  Will follow up pathology and manage accordingly.  Routine preventative health maintenance measures emphasized.  Physical Exam Genitourinary:        Adelene Idler MD [facility], [facility] 03/26/2020 8:36 AM

## 2020-03-26 NOTE — Progress Notes (Signed)
Colposcopy.  Pt states she's having some discharge and wants some STD Testing today.

## 2020-03-27 LAB — HEPATITIS PANEL, ACUTE
Hep A IgM: NEGATIVE
Hep B C IgM: NEGATIVE
Hep C Virus Ab: <0.1 s/co ratio (ref 0.0–0.9)
Hepatitis B Surface Ag: NEGATIVE

## 2020-03-27 LAB — HIV ANTIBODY (ROUTINE TESTING W REFLEX): HIV Screen 4th Generation wRfx: NONREACTIVE

## 2020-03-27 LAB — RPR: RPR Ser Ql: NONREACTIVE

## 2020-03-29 LAB — SURGICAL PATHOLOGY

## 2020-03-30 LAB — NUSWAB VAGINITIS PLUS (VG+)
Candida albicans, NAA: NEGATIVE
Candida glabrata, NAA: NEGATIVE
Chlamydia trachomatis, NAA: NEGATIVE
Neisseria gonorrhoeae, NAA: NEGATIVE
Trich vag by NAA: NEGATIVE

## 2020-04-05 ENCOUNTER — Ambulatory Visit: Payer: 59 | Admitting: Obstetrics and Gynecology

## 2020-04-28 DIAGNOSIS — Z20822 Contact with and (suspected) exposure to covid-19: Secondary | ICD-10-CM | POA: Diagnosis not present

## 2020-05-24 DIAGNOSIS — R1013 Epigastric pain: Secondary | ICD-10-CM | POA: Diagnosis not present

## 2020-05-24 DIAGNOSIS — R4586 Emotional lability: Secondary | ICD-10-CM | POA: Diagnosis not present

## 2020-07-21 DIAGNOSIS — K219 Gastro-esophageal reflux disease without esophagitis: Secondary | ICD-10-CM | POA: Diagnosis not present

## 2020-07-21 DIAGNOSIS — R1013 Epigastric pain: Secondary | ICD-10-CM | POA: Diagnosis not present

## 2020-08-26 ENCOUNTER — Ambulatory Visit: Payer: 59 | Admitting: Obstetrics and Gynecology

## 2020-08-31 NOTE — Progress Notes (Deleted)
    [name], MD   No chief complaint on file.   HPI:      [name] is a 30 y.o. G0P0000 whose LMP was No LMP recorded., presents today for ***  Neg STD testing 12/21  Past Medical History:  Diagnosis Date  . BRCA negative 02/2018   MyRisk neg except MSH3 VUS  . BV (bacterial vaginosis)   . Family history of breast cancer   . Gonorrhea   . Increased risk of breast cancer 02/2018   IBIS=21%  . UTI (lower urinary tract infection)     Past Surgical History:  Procedure Laterality Date  . BREAST REDUCTION SURGERY  04/2016    Family History  Problem Relation Age of Onset  . Schizophrenia Mother   . Autism Brother   . Heart disease Maternal Grandfather   . Breast cancer Paternal Aunt 55  . Ovarian cancer Maternal Grandmother 42       "female cancer" ? primary site--extensive spread    Social History   Socioeconomic History  . Marital status: Single    Spouse name: Not on file  . Number of children: Not on file  . Years of education: Not on file  . Highest education level: Not on file  Occupational History  . Not on file  Tobacco Use  . Smoking status: Never Smoker  . Smokeless tobacco: Never Used  Vaping Use  . Vaping Use: Never used  Substance and Sexual Activity  . Alcohol use: Yes    Comment: socially  . Drug use: No  . Sexual activity: Yes    Birth control/protection: None  Other Topics Concern  . Not on file  Social History Narrative  . Not on file   Social Determinants of Health   Financial Resource Strain: Not on file  Food Insecurity: Not on file  Transportation Needs: Not on file  Physical Activity: Not on file  Stress: Not on file  Social Connections: Not on file  Intimate Partner Violence: Not on file    Outpatient Medications Prior to Visit  Medication Sig Dispense Refill  . cyclobenzaprine (FLEXERIL) 10 MG tablet Take 10 mg by mouth 3 (three) times daily as needed. for muscle spams    . Multiple Vitamin  (MULTIVITAMIN) tablet Take 1 tablet by mouth daily.    . norelgestromin-ethinyl estradiol Marilu Favre) 150-35 MCG/24HR transdermal patch Place 1 patch onto the skin once a week. Apply 1 patch weekly for 3 weeks, then 1 week without patch 9 patch 3  . Vitamin D, Cholecalciferol, 50 MCG (2000 UT) CAPS Take by mouth.     No facility-administered medications prior to visit.      ROS:  Review of Systems BREAST: No symptoms   OBJECTIVE:   Vitals:  There were no vitals taken for this visit.  Physical Exam  Results: No results found for this or any previous visit (from the past 24 hour(s)).   Assessment/Plan: No diagnosis found.    No orders of the defined types were placed in this encounter.     No follow-ups on file.  Shareena Nusz B. Florita Nitsch, PA-C 08/31/2020 4:36 PM

## 2020-09-01 ENCOUNTER — Ambulatory Visit: Payer: 59 | Admitting: Obstetrics and Gynecology

## 2020-09-27 ENCOUNTER — Encounter: Payer: Self-pay | Admitting: *Deleted

## 2020-09-28 ENCOUNTER — Ambulatory Visit: Payer: 59 | Admitting: Certified Registered Nurse Anesthetist

## 2020-09-28 ENCOUNTER — Encounter: Payer: Self-pay | Admitting: *Deleted

## 2020-09-28 ENCOUNTER — Encounter
Admission: RE | Disposition: A | Discharge status: Home / Self Care | Payer: Self-pay | Source: Home / Self Care | Attending: Gastroenterology

## 2020-09-28 ENCOUNTER — Ambulatory Visit
Admission: RE | Admit: 2020-09-28 | Discharge: 2020-09-28 | Disposition: A | Discharge status: Home / Self Care | Payer: 59 | Attending: Gastroenterology | Admitting: Gastroenterology

## 2020-09-28 DIAGNOSIS — K3189 Other diseases of stomach and duodenum: Secondary | ICD-10-CM | POA: Insufficient documentation

## 2020-09-28 DIAGNOSIS — K2950 Unspecified chronic gastritis without bleeding: Secondary | ICD-10-CM | POA: Diagnosis not present

## 2020-09-28 DIAGNOSIS — Z79899 Other long term (current) drug therapy: Secondary | ICD-10-CM | POA: Diagnosis not present

## 2020-09-28 DIAGNOSIS — B9681 Helicobacter pylori [H. pylori] as the cause of diseases classified elsewhere: Secondary | ICD-10-CM | POA: Diagnosis not present

## 2020-09-28 DIAGNOSIS — K2930 Chronic superficial gastritis without bleeding: Secondary | ICD-10-CM | POA: Diagnosis not present

## 2020-09-28 DIAGNOSIS — K2970 Gastritis, unspecified, without bleeding: Secondary | ICD-10-CM | POA: Diagnosis not present

## 2020-09-28 DIAGNOSIS — K219 Gastro-esophageal reflux disease without esophagitis: Secondary | ICD-10-CM | POA: Diagnosis not present

## 2020-09-28 HISTORY — PX: ESOPHAGOGASTRODUODENOSCOPY (EGD) WITH PROPOFOL: SHX5813

## 2020-09-28 HISTORY — DX: Gastro-esophageal reflux disease without esophagitis: K21.9

## 2020-09-28 LAB — POCT PREGNANCY, URINE: Preg Test, Ur: NEGATIVE

## 2020-09-28 SURGERY — ESOPHAGOGASTRODUODENOSCOPY (EGD) WITH PROPOFOL
Anesthesia: General

## 2020-09-28 MED ORDER — PROPOFOL 500 MG/50ML IV EMUL
INTRAVENOUS | Status: DC | PRN
  Administered 2020-09-28: 125 ug/kg/min via INTRAVENOUS

## 2020-09-28 MED ORDER — SODIUM CHLORIDE 0.9 % IV SOLN
INTRAVENOUS | Status: DC | PRN
Start: 2020-09-28 — End: 2020-09-28

## 2020-09-28 MED ORDER — LIDOCAINE HCL (CARDIAC) PF 100 MG/5ML IV SOSY
PREFILLED_SYRINGE | INTRAVENOUS | Status: DC | PRN
  Administered 2020-09-28: 40 mg via INTRAVENOUS

## 2020-09-28 MED ORDER — SODIUM CHLORIDE 0.9 % IV SOLN
INTRAVENOUS | Status: DC
  Administered 2020-09-28: 1000 mL via INTRAVENOUS

## 2020-09-28 MED ORDER — GLYCOPYRROLATE 0.2 MG/ML IJ SOLN
INTRAMUSCULAR | Status: DC | PRN
  Administered 2020-09-28: .2 mg via INTRAVENOUS

## 2020-09-28 MED ORDER — PROPOFOL 10 MG/ML IV BOLUS
INTRAVENOUS | Status: DC | PRN
  Administered 2020-09-28: 30 mg via INTRAVENOUS
  Administered 2020-09-28 (×2): 50 mg via INTRAVENOUS

## 2020-09-28 MED ORDER — ONDANSETRON HCL 4 MG/2ML IJ SOLN
INTRAMUSCULAR | Status: DC | PRN
  Administered 2020-09-28: 4 mg via INTRAVENOUS

## 2020-09-28 MED ORDER — MIDAZOLAM HCL 2 MG/2ML IJ SOLN
INTRAMUSCULAR | Status: DC | PRN
  Administered 2020-09-28: 2 mg via INTRAVENOUS

## 2020-09-28 NOTE — Op Note (Signed)
[facility] Patient Name: Traci Flowers Procedure Date: 09/28/2020 8:36 AM MRN: [id number] Account #: [id number] Date of Birth: 02-14-1991 Admit Type: Outpatient Age: 30 Room: [facility] ENDO ROOM 2 Gender: Female Note Status: Finalized Procedure:             Upper GI endoscopy Indications:           Epigastric abdominal pain, Gastro-esophageal reflux                         disease Providers:             Andrey Farmer MD, MD Referring MD:          [name] (Referring MD) Medicines:             Monitored Anesthesia Care Complications:         No immediate complications. Estimated blood loss:                         Minimal. Procedure:             Pre-Anesthesia Assessment:                        - Prior to the procedure, a History and Physical was                         performed, and patient medications and allergies were                         reviewed. The patient is competent. The risks and                         benefits of the procedure and the sedation options and                         risks were discussed with the patient. All questions                         were answered and informed consent was obtained.                         Patient identification and proposed procedure were                         verified by the physician, the nurse, the anesthetist                         and the technician in the endoscopy suite. Mental                         Status Examination: alert and oriented. Airway                         Examination: normal oropharyngeal airway and neck                         mobility. Respiratory Examination: clear to                         auscultation. CV  Examination: normal. Prophylactic                         Antibiotics: The patient does not require prophylactic                         antibiotics. Prior Anticoagulants: The patient has                         taken no previous anticoagulant or  antiplatelet                         agents. ASA Grade Assessment: I - A normal, healthy                         patient. After reviewing the risks and benefits, the                         patient was deemed in satisfactory condition to                         undergo the procedure. The anesthesia plan was to use                         monitored anesthesia care (MAC). Immediately prior to                         administration of medications, the patient was                         re-assessed for adequacy to receive sedatives. The                         heart rate, respiratory rate, oxygen saturations,                         blood pressure, adequacy of pulmonary ventilation, and                         response to care were monitored throughout the                         procedure. The physical status of the patient was                         re-assessed after the procedure.                        After obtaining informed consent, the endoscope was                         passed under direct vision. Throughout the procedure,                         the patient's blood pressure, pulse, and oxygen                         saturations were monitored continuously. The Endoscope  was introduced through the mouth, and advanced to the                         second part of duodenum. The upper GI endoscopy was                         accomplished without difficulty. The patient tolerated                         the procedure well. Findings:      The examined esophagus was normal.      Patchy minimal inflammation characterized by erythema was found in the       entire examined stomach. Biopsies were taken with a cold forceps for       Helicobacter pylori testing. Estimated blood loss was minimal.      Localized mildly erythematous mucosa without active bleeding and with no       stigmata of bleeding was found in the second portion of the duodenum.       Biopsies were  taken with a cold forceps for histology. Estimated blood       loss was minimal.      The exam of the duodenum was otherwise normal. Impression:            - Normal esophagus.                        - Gastritis. Biopsied.                        - Erythematous duodenopathy. Biopsied. Recommendation:        - Discharge patient to home.                        - Resume previous diet.                        - Continue present medications.                        - Await pathology results.                        - Return to referring physician as previously                         scheduled. Procedure Code(s):     --- Professional ---                        [phone], Esophagogastroduodenoscopy, flexible,                         transoral; with biopsy, single or multiple Diagnosis Code(s):     --- Professional ---                        K29.70, Gastritis, unspecified, without bleeding                        R10.13, Epigastric pain                        K21.9, Gastro-esophageal reflux  disease without                         esophagitis CPT copyright 2019 American [employer] Association. All rights reserved. The codes documented in this report are preliminary and upon coder review may  be revised to meet current compliance requirements. Andrey Farmer MD, MD 09/28/2020 9:04:32 AM Number of Addenda: 0 Note Initiated On: 09/28/2020 8:36 AM Estimated Blood Loss:  Estimated blood loss: none. Estimated blood loss was                         minimal.      [facility]

## 2020-09-28 NOTE — Transfer of Care (Signed)
Immediate Anesthesia Transfer of Care Note  Patient: Traci Flowers  Procedure(s) Performed: ESOPHAGOGASTRODUODENOSCOPY (EGD) WITH PROPOFOL (N/A )  Patient Location: PACU  Anesthesia Type:General  Level of Consciousness: awake, alert  and oriented  Airway & Oxygen Therapy: Patient Spontanous Breathing  Post-op Assessment: Report given to RN  Post vital signs: Reviewed and stable  Last Vitals:  Vitals Value Taken Time  BP 114/69 09/28/20 0856  Temp    Pulse 77 09/28/20 0856  Resp 24 09/28/20 0856  SpO2 100 % 09/28/20 0856  Vitals shown include unvalidated device data.  Last Pain:  Vitals:   09/28/20 0757  PainSc: 0-No pain         Complications: No complications documented.

## 2020-09-28 NOTE — H&P (Signed)
Outpatient short stay form Pre-procedure 09/28/2020 8:36 AM Trevor Locklear MD, MPH  Primary Physician: Dr. Feldpausch  Reason for visit:  Abdominal pain/GERD  History of present illness:   29 y/o lady with history of GERD and new chronic abdominal pain here for EGD. No family history of any GI issues. No blood thinners. No abdominal surgeries.    Current Facility-Administered Medications:  .  0.9 %  sodium chloride infusion, , Intravenous, Continuous, Locklear, Cameron T, MD, Last Rate: 20 mL/hr at 09/28/20 0814, 1,000 mL at 09/28/20 0814  Medications Prior to Admission  Medication Sig Dispense Refill Last Dose  . famotidine (PEPCID) 20 MG tablet Take 20 mg by mouth 2 (two) times daily.   09/27/2020 at Unknown time  . fluconazole (DIFLUCAN) 150 MG tablet Take 150 mg by mouth once a week.   09/27/2020 at Unknown time  . Multiple Vitamin (MULTIVITAMIN) tablet Take 1 tablet by mouth daily.   Past Week at Unknown time  . norelgestromin-ethinyl estradiol (XULANE) 150-35 MCG/24HR transdermal patch Place 1 patch onto the skin once a week. Apply 1 patch weekly for 3 weeks, then 1 week without patch 9 patch 3 09/28/2020 at Unknown time  . cyclobenzaprine (FLEXERIL) 10 MG tablet Take 10 mg by mouth 3 (three) times daily as needed. for muscle spams     . Vitamin D, Cholecalciferol, 50 MCG (2000 UT) CAPS Take by mouth. (Patient not taking: Reported on 09/28/2020)   Not Taking at Unknown time     No Known Allergies   Past Medical History:  Diagnosis Date  . BRCA negative 02/2018   MyRisk neg except MSH3 VUS  . BV (bacterial vaginosis)   . Family history of breast cancer   . GERD (gastroesophageal reflux disease)   . Gonorrhea   . Increased risk of breast cancer 02/2018   IBIS=21%  . UTI (lower urinary tract infection)     Review of systems:  Otherwise negative.    Physical Exam  Gen: Alert, oriented. Appears stated age.  HEENT: PERRLA. Lungs: No respiratory distress CV: RRR Abd: soft,  benign, no masses Ext: No edema    Planned procedures: Proceed with EGD. The patient understands the nature of the planned procedure, indications, risks, alternatives and potential complications including but not limited to bleeding, infection, perforation, damage to internal organs and possible oversedation/side effects from anesthesia. The patient agrees and gives consent to proceed.  Please refer to procedure notes for findings, recommendations and patient disposition/instructions.     Trevor Locklear MD, MPH Gastroenterology 09/28/2020  8:36 AM     

## 2020-09-28 NOTE — Anesthesia Postprocedure Evaluation (Signed)
Anesthesia Post Note  Patient: Traci Flowers  Procedure(s) Performed: ESOPHAGOGASTRODUODENOSCOPY (EGD) WITH PROPOFOL (N/A )  Patient location during evaluation: Endoscopy Anesthesia Type: General Level of consciousness: awake and alert Pain management: pain level controlled Vital Signs Assessment: post-procedure vital signs reviewed and stable Respiratory status: spontaneous breathing, nonlabored ventilation, respiratory function stable and patient connected to nasal cannula oxygen Cardiovascular status: blood pressure returned to baseline and stable Postop Assessment: no apparent nausea or vomiting Anesthetic complications: no   No complications documented.   Last Vitals:  Vitals:   09/28/20 0856 09/28/20 0916  BP: 114/69 118/76  Pulse: 78   Resp: (!) 25   Temp: 36.4 C   SpO2: 99%     Last Pain:  Vitals:   09/28/20 0916  TempSrc:   PainSc: 0-No pain                 Corinda Gubler

## 2020-09-28 NOTE — Anesthesia Preprocedure Evaluation (Signed)
Anesthesia Evaluation  Patient identified by MRN, date of birth, ID band Patient awake    Reviewed: Allergy & Precautions, NPO status , Patient's Chart, lab work & pertinent test results  History of Anesthesia Complications Negative for: history of anesthetic complications  Airway Mallampati: I  TM Distance: >3 FB Neck ROM: Full    Dental no notable dental hx. (+) Teeth Intact   Pulmonary neg pulmonary ROS, neg sleep apnea, neg COPD, Patient abstained from smoking.Not current smoker,    Pulmonary exam normal breath sounds clear to auscultation       Cardiovascular Exercise Tolerance: Good METS(-) hypertension(-) CAD and (-) Past MI negative cardio ROS  (-) dysrhythmias  Rhythm:Regular Rate:Normal - Systolic murmurs    Neuro/Psych negative neurological ROS  negative psych ROS   GI/Hepatic GERD  ,(+)     (-) substance abuse  ,   Endo/Other  neg diabetes  Renal/GU negative Renal ROS     Musculoskeletal   Abdominal   Peds  Hematology   Anesthesia Other Findings Past Medical History: 02/2018: BRCA negative     Comment:  MyRisk neg except MSH3 VUS No date: BV (bacterial vaginosis) No date: Family history of breast cancer No date: GERD (gastroesophageal reflux disease) No date: Gonorrhea 02/2018: Increased risk of breast cancer     Comment:  IBIS=21% No date: UTI (lower urinary tract infection)  Reproductive/Obstetrics                             Anesthesia Physical Anesthesia Plan  ASA: II  Anesthesia Plan: General   Post-op Pain Management:    Induction: Intravenous  PONV Risk Score and Plan: 3 and Ondansetron, Propofol infusion and TIVA  Airway Management Planned: Nasal Cannula  Additional Equipment: None  Intra-op Plan:   Post-operative Plan:   Informed Consent: I have reviewed the patients History and Physical, chart, labs and discussed the procedure including the  risks, benefits and alternatives for the proposed anesthesia with the patient or authorized representative who has indicated his/her understanding and acceptance.     Dental advisory given  Plan Discussed with: CRNA and Surgeon  Anesthesia Plan Comments: (Discussed risks of anesthesia with patient, including possibility of difficulty with spontaneous ventilation under anesthesia necessitating airway intervention, PONV, and rare risks such as cardiac or respiratory or neurological events. Patient understands.)        Anesthesia Quick Evaluation

## 2020-09-28 NOTE — Interval H&P Note (Signed)
History and Physical Interval Note:  09/28/2020 8:38 AM  Traci Flowers  has presented today for surgery, with the diagnosis of EPIGASTRIC PAIN GERD.  The various methods of treatment have been discussed with the patient and family. After consideration of risks, benefits and other options for treatment, the patient has consented to  Procedure(s): ESOPHAGOGASTRODUODENOSCOPY (EGD) WITH PROPOFOL (N/A) as a surgical intervention.  The patient's history has been reviewed, patient examined, no change in status, stable for surgery.  I have reviewed the patient's chart and labs.  Questions were answered to the patient's satisfaction.     Regis Bill  Ok to proceed with colonoscopy

## 2020-09-29 ENCOUNTER — Encounter: Payer: Self-pay | Admitting: Gastroenterology

## 2020-09-29 LAB — SURGICAL PATHOLOGY

## 2020-10-18 ENCOUNTER — Other Ambulatory Visit: Payer: Self-pay | Admitting: Obstetrics and Gynecology

## 2020-11-17 DIAGNOSIS — A048 Other specified bacterial intestinal infections: Secondary | ICD-10-CM | POA: Diagnosis not present

## 2020-11-22 NOTE — Progress Notes (Signed)
Traci Goodell, MD   Chief Complaint  Patient presents with   STD testing    HPI:      [name] is a 30 y.o. G0P0000 whose LMP was Patient's last menstrual period was 11/19/2020 (exact date)., presents today for STD testing. Pt with vaginal irritation/itch for 4 days, no increased d/c. Had odor the first day but resolved. Pt then started her period and is wearing pads. Uses unscented soap, uses dryer sheets. Was on abx last month. Hx of recurrent yeast vag, did wkly diflucan for 6 months with sx control, no longer taking it; hx of recurent BV, did boric acid in past.  She is sex active, with new "old" partner. Using xulane BC. Neg STD testing 12/21. She is MyRisk neg for FH breast cancer, but at increased risk of breast cancer. She has not had a mammo and is willing to start them age 81 as recommended.     Past Medical History:  Diagnosis Date   BRCA negative 02/2018   MyRisk neg except MSH3 VUS   BV (bacterial vaginosis)    Family history of breast cancer    GERD (gastroesophageal reflux disease)    Gonorrhea    Increased risk of breast cancer 02/2018   IBIS=21%   UTI (lower urinary tract infection)     Past Surgical History:  Procedure Laterality Date   BREAST REDUCTION SURGERY  04/2016   ESOPHAGOGASTRODUODENOSCOPY (EGD) WITH PROPOFOL N/A 09/28/2020   Procedure: ESOPHAGOGASTRODUODENOSCOPY (EGD) WITH PROPOFOL;  Surgeon: Regis Bill, MD;  Location: [facility] ENDOSCOPY;  Service: Endoscopy;  Laterality: N/A;    Family History  Problem Relation Age of Onset   Schizophrenia Mother    Autism Brother    Heart disease Maternal Grandfather    Breast cancer Paternal Aunt 90   Ovarian cancer Maternal Grandmother 1       "female cancer" ? primary site--extensive spread    Social History   Socioeconomic History   Marital status: Single    Spouse name: Not on file   Number of children: Not on file   Years of education: Not on file   Highest  education level: Not on file  Occupational History   Not on file  Tobacco Use   Smoking status: Never   Smokeless tobacco: Never  Vaping Use   Vaping Use: Never used  Substance and Sexual Activity   Alcohol use: Yes    Comment: socially   Drug use: No   Sexual activity: Yes    Birth control/protection: Patch, Condom  Other Topics Concern   Not on file  Social History Narrative   Not on file   Social Determinants of Health   Financial Resource Strain: Not on file  Food Insecurity: Not on file  Transportation Needs: Not on file  Physical Activity: Not on file  Stress: Not on file  Social Connections: Not on file  Intimate Partner Violence: Not on file    Outpatient Medications Prior to Visit  Medication Sig Dispense Refill   famotidine (PEPCID) 20 MG tablet Take 1 tablet by mouth 2 (two) times daily.     hyoscyamine (LEVSIN SL) 0.125 MG SL tablet PLEASE SEE ATTACHED FOR DETAILED DIRECTIONS     norelgestromin-ethinyl estradiol [employer]) 150-35 MCG/24HR transdermal patch Place 1 patch onto the skin once a week. Apply 1 patch weekly for 3 weeks, then 1 week without patch 9 patch 3   cyclobenzaprine (FLEXERIL) 10 MG tablet Take 10 mg by  mouth 3 (three) times daily as needed. for muscle spams     famotidine (PEPCID) 20 MG tablet Take 20 mg by mouth 2 (two) times daily.     fluconazole (DIFLUCAN) 150 MG tablet TAKE 1 TABLET (150 MG TOTAL) BY MOUTH ONCE A WEEK FOR 25 DOSES. 4 tablet 6   Multiple Vitamin (MULTIVITAMIN) tablet Take 1 tablet by mouth daily.     Vitamin D, Cholecalciferol, 50 MCG (2000 UT) CAPS Take by mouth. (Patient not taking: Reported on 09/28/2020)     No facility-administered medications prior to visit.      ROS:  Review of Systems  Constitutional:  Negative for fever.  Gastrointestinal:  Negative for blood in stool, constipation, diarrhea, nausea and vomiting.  Genitourinary:  Negative for dyspareunia, dysuria, flank pain, frequency, hematuria, urgency,  vaginal bleeding, vaginal discharge and vaginal pain.  Musculoskeletal:  Negative for back pain.  Skin:  Negative for rash.  BREAST: No symptoms   OBJECTIVE:   Vitals:  BP 100/60   Ht $R'5\' 3"'wm$  (1.6 m)   Wt 150 lb (68 kg)   LMP 11/19/2020 (Exact Date)   BMI 26.57 kg/m   Physical Exam Vitals reviewed.  Constitutional:      Appearance: She is well-developed.  Pulmonary:     Effort: Pulmonary effort is normal.  Genitourinary:    Pubic Area: No rash.      Labia:        Right: Tenderness present. No rash or lesion.        Left: Tenderness present. No rash or lesion.      Vagina: Bleeding present. No vaginal discharge, erythema or tenderness.     Cervix: Normal.     Uterus: Normal. Not enlarged and not tender.      Adnexa: Right adnexa normal and left adnexa normal.       Right: No mass or tenderness.         Left: No mass or tenderness.      Musculoskeletal:        General: Normal range of motion.     Cervical back: Normal range of motion.  Skin:    General: Skin is warm and dry.  Neurological:     General: No focal deficit present.     Mental Status: She is alert and oriented to person, place, and time.  Psychiatric:        Mood and Affect: Mood normal.        Behavior: Behavior normal.        Thought Content: Thought content normal.        Judgment: Judgment normal.    Results: Results for orders placed or performed in visit on 11/23/20 (from the past 24 hour(s))  POCT Wet Prep with KOH     Status: Normal   Collection Time: 11/23/20 11:21 AM  Result Value Ref Range   Trichomonas, UA Negative    Clue Cells Wet Prep HPF POC neg    Epithelial Wet Prep HPF POC     Yeast Wet Prep HPF POC neg    Bacteria Wet Prep HPF POC     RBC Wet Prep HPF POC     WBC Wet Prep HPF POC     KOH Prep POC Negative Negative     Assessment/Plan: Vaginal irritation - Plan: Cervicovaginal ancillary only, POCT Wet Prep with KOH; pos sx and ext exam; no yeast d/c, neg wet prep. Question  chem derm vs fungal. D/C dryer sheets, OTC hydrocortisone crm ext  prn itch. Check culture. Will f/u if pos.   Screening for STD (sexually transmitted disease) - Plan: HIV Antibody (routine testing w rflx), RPR, Hepatitis C antibody, Cervicovaginal ancillary only  Encounter for screening mammogram for malignant neoplasm of breast - Plan: MM 3D SCREEN BREAST BILATERAL; pt to sched mammo  Family history of breast cancer - Plan: MM 3D SCREEN BREAST BILATERAL  Increased risk of breast cancer - Plan: MM 3D SCREEN BREAST BILATERAL    Return if symptoms worsen or fail to improve.  Nandan Willems B. Verlyn Dannenberg, PA-C 11/23/2020 11:23 AM

## 2020-11-23 ENCOUNTER — Other Ambulatory Visit: Payer: Self-pay

## 2020-11-23 ENCOUNTER — Ambulatory Visit (INDEPENDENT_AMBULATORY_CARE_PROVIDER_SITE_OTHER): Payer: 59 | Admitting: Obstetrics and Gynecology

## 2020-11-23 ENCOUNTER — Other Ambulatory Visit (HOSPITAL_COMMUNITY)
Admission: RE | Admit: 2020-11-23 | Discharge: 2020-11-23 | Disposition: A | Discharge status: Home / Self Care | Payer: 59 | Source: Ambulatory Visit | Attending: Obstetrics and Gynecology | Admitting: Obstetrics and Gynecology

## 2020-11-23 ENCOUNTER — Encounter: Payer: Self-pay | Admitting: Obstetrics and Gynecology

## 2020-11-23 VITALS — BP 100/60 | Ht 63.0 in | Wt 150.0 lb

## 2020-11-23 DIAGNOSIS — Z113 Encounter for screening for infections with a predominantly sexual mode of transmission: Secondary | ICD-10-CM

## 2020-11-23 DIAGNOSIS — Z1231 Encounter for screening mammogram for malignant neoplasm of breast: Secondary | ICD-10-CM | POA: Diagnosis not present

## 2020-11-23 DIAGNOSIS — N898 Other specified noninflammatory disorders of vagina: Secondary | ICD-10-CM | POA: Insufficient documentation

## 2020-11-23 DIAGNOSIS — Z9189 Other specified personal risk factors, not elsewhere classified: Secondary | ICD-10-CM | POA: Diagnosis not present

## 2020-11-23 DIAGNOSIS — Z803 Family history of malignant neoplasm of breast: Secondary | ICD-10-CM

## 2020-11-23 LAB — POCT WET PREP WITH KOH
Clue Cells Wet Prep HPF POC: NEGATIVE
KOH Prep POC: NEGATIVE
Trichomonas, UA: NEGATIVE
Yeast Wet Prep HPF POC: NEGATIVE

## 2020-11-24 LAB — HEPATITIS C ANTIBODY: Hep C Virus Ab: <0.1 s/co ratio (ref 0.0–0.9)

## 2020-11-24 LAB — CERVICOVAGINAL ANCILLARY ONLY
Bacterial Vaginitis (gardnerella): NEGATIVE
Candida Glabrata: NEGATIVE
Candida Vaginitis: POSITIVE — AB
Chlamydia: NEGATIVE
Comment: NEGATIVE
Comment: NEGATIVE
Comment: NEGATIVE
Comment: NEGATIVE
Comment: NEGATIVE
Comment: NORMAL
Neisseria Gonorrhea: NEGATIVE
Trichomonas: NEGATIVE

## 2020-11-24 LAB — HIV ANTIBODY (ROUTINE TESTING W REFLEX): HIV Screen 4th Generation wRfx: NONREACTIVE

## 2020-11-24 LAB — RPR: RPR Ser Ql: NONREACTIVE

## 2020-11-24 MED ORDER — FLUCONAZOLE 150 MG PO TABS: 150.0000 mg | ORAL_TABLET | Freq: Once | ORAL | 0 refills | Status: AC

## 2020-11-24 NOTE — Addendum Note (Signed)
Addended by: Althea Grimmer B on: 11/24/2020 03:38 PM   Modules accepted: Orders

## 2021-03-24 ENCOUNTER — Encounter: Payer: Self-pay | Admitting: Emergency Medicine

## 2021-03-24 ENCOUNTER — Other Ambulatory Visit: Payer: Self-pay

## 2021-03-24 ENCOUNTER — Ambulatory Visit
Admission: EM | Admit: 2021-03-24 | Discharge: 2021-03-24 | Disposition: A | Discharge status: Home / Self Care | Payer: 59 | Attending: Family Medicine | Admitting: Family Medicine

## 2021-03-24 DIAGNOSIS — R21 Rash and other nonspecific skin eruption: Secondary | ICD-10-CM | POA: Insufficient documentation

## 2021-03-24 DIAGNOSIS — N760 Acute vaginitis: Secondary | ICD-10-CM | POA: Diagnosis not present

## 2021-03-24 LAB — POCT URINALYSIS DIP (MANUAL ENTRY)
Bilirubin, UA: NEGATIVE
Blood, UA: NEGATIVE
Glucose, UA: NEGATIVE mg/dL
Ketones, POC UA: NEGATIVE mg/dL
Leukocytes, UA: NEGATIVE
Nitrite, UA: NEGATIVE
Protein Ur, POC: NEGATIVE mg/dL
Spec Grav, UA: 1.025 (ref 1.010–1.025)
Urobilinogen, UA: 1 E.U./dL
pH, UA: 7 (ref 5.0–8.0)

## 2021-03-24 LAB — POCT URINE PREGNANCY: Preg Test, Ur: NEGATIVE

## 2021-03-24 MED ORDER — LEVOCETIRIZINE DIHYDROCHLORIDE 5 MG PO TABS
5.0000 mg | ORAL_TABLET | Freq: Every evening | ORAL | 0 refills | Status: DC
Start: 2021-03-24 — End: 2022-06-20

## 2021-03-24 MED ORDER — METRONIDAZOLE 500 MG PO TABS: 500.0000 mg | ORAL_TABLET | Freq: Two times a day (BID) | ORAL | 0 refills | Status: DC

## 2021-03-24 MED ORDER — TRIAMCINOLONE ACETONIDE 0.025 % EX OINT: 1.0000 "application " | TOPICAL_OINTMENT | Freq: Two times a day (BID) | CUTANEOUS | 0 refills | Status: DC

## 2021-03-24 NOTE — ED Provider Notes (Signed)
[name]    CSN: [id number] Arrival date & time: 03/24/21  1438      History   Chief Complaint Chief Complaint  Patient presents with   Insect Bite   Vaginal Odor     HPI Traci Flowers is a 30 y.o. female.   HPI Patient presents with vaginitis symptoms x 2 weeks. She has tried taking a Diflucan along with doing a boric acid treatment without any relief.  She denies any pain or itching but endorses a strong odor.  She does have a history of both bacterial vaginosis and yeast related infection.  Unrelated patient is concerned regarding 1 week of bilateral bumps that have erupted and caused bruising.  She recently had her house treated for insects.  Uncertain of any known irritant.  She denies changing any soaps or detergents or cosmetic products. Past Medical History:  Diagnosis Date   BRCA negative 02/2018   MyRisk neg except MSH3 VUS   BV (bacterial vaginosis)    Family history of breast cancer    GERD (gastroesophageal reflux disease)    Gonorrhea    Increased risk of breast cancer 02/2018   IBIS=21%   UTI (lower urinary tract infection)     Patient Active Problem List   Diagnosis Date Noted   Family history of breast cancer 02/24/2019   Increased risk of breast cancer 03/12/2018   Gonorrhea 02/20/2018   S/P bilateral breast reduction 05/19/2016   Macromastia 01/31/2016   Back pain 01/30/2012    Past Surgical History:  Procedure Laterality Date   BREAST REDUCTION SURGERY  04/2016   ESOPHAGOGASTRODUODENOSCOPY (EGD) WITH PROPOFOL N/A 09/28/2020   Procedure: ESOPHAGOGASTRODUODENOSCOPY (EGD) WITH PROPOFOL;  Surgeon: Lesly Rubenstein, MD;  Location: [facility] ENDOSCOPY;  Service: Endoscopy;  Laterality: N/A;    OB History     Gravida  0   Para  0   Term  0   Preterm  0   AB  0   Living  0      SAB  0   IAB  0   Ectopic  0   Multiple  0   Live Births  0            Home Medications    Prior to Admission medications    Medication Sig Start Date End Date Taking? Authorizing Provider  levocetirizine (XYZAL) 5 MG tablet Take 1 tablet (5 mg total) by mouth every evening for 14 days. 03/24/21 04/07/21 Yes Scot Jun, FNP  metroNIDAZOLE (FLAGYL) 500 MG tablet Take 1 tablet (500 mg total) by mouth 2 (two) times daily. 03/24/21  Yes Scot Jun, FNP  norelgestromin-ethinyl estradiol Marilu Favre) 150-35 MCG/24HR transdermal patch Place 1 patch onto the skin once a week. Apply 1 patch weekly for 3 weeks, then 1 week without patch 90/3/00  Yes Copland, Alicia B, PA-C  triamcinolone (KENALOG) 0.025 % ointment Apply 1 application topically 2 (two) times daily. 03/24/21  Yes Scot Jun, FNP  famotidine (PEPCID) 20 MG tablet Take 1 tablet by mouth 2 (two) times daily. 07/21/20 07/21/21  [provider]  hyoscyamine (LEVSIN SL) 0.125 MG SL tablet PLEASE SEE ATTACHED FOR DETAILED DIRECTIONS 07/21/20   [provider]    Family History Family History  Problem Relation Age of Onset   Schizophrenia Mother    Autism Brother    Heart disease Maternal Grandfather    Breast cancer Paternal Aunt 56   Ovarian cancer Maternal Grandmother 12       "  female cancer" ? primary site--extensive spread    Social History Social History   Tobacco Use   Smoking status: Never   Smokeless tobacco: Never  Vaping Use   Vaping Use: Never used  Substance Use Topics   Alcohol use: Yes    Comment: socially   Drug use: No     Allergies   Patient has no known allergies.   Review of Systems Review of Systems Pertinent negatives listed in HPI   Physical Exam Triage Vital Signs ED Triage Vitals  Enc Vitals Group     BP 03/24/21 1600 103/69     Pulse Rate 03/24/21 1600 67     Resp --      Temp 03/24/21 1600 98.9 F (37.2 C)     Temp Source 03/24/21 1600 Oral     SpO2 03/24/21 1600 99 %     Weight --      Height --      Head Circumference --      Peak Flow --      Pain Score 03/24/21 1557 2      Pain Loc --      Pain Edu? --      Excl. in [place]? --    No data found.  Updated Vital Signs BP 103/69 (BP Location: Left Arm)   Pulse 67   Temp 98.9 F (37.2 C) (Oral)   LMP 03/17/2021   SpO2 99%   Visual Acuity Right Eye Distance:   Left Eye Distance:   Bilateral Distance:    Right Eye Near:   Left Eye Near:    Bilateral Near:     Physical Exam General appearance: Alert, well developed, well nourished, cooperative  Head: Normocephalic, without obvious abnormality, atraumatic Respiratory: Respirations even and unlabored, normal respiratory rate Heart: Rate and rhythm normal. No gallop or murmurs noted on exam  Extremities: No gross deformities Skin: Hive-like lesions present on arms bruising on the back of the legs skin color, texture, turgor normal. No rashes seen    UC Treatments / Results  Labs (all labs ordered are listed, but only abnormal results are displayed) Labs Reviewed  POCT URINALYSIS DIP (MANUAL ENTRY)  POCT URINE PREGNANCY  CERVICOVAGINAL ANCILLARY ONLY    EKG   Radiology No results found.  Procedures Procedures (including critical care time)  Medications Ordered in UC Medications - No data to display  Initial Impression / Assessment and Plan / UC Course  I have reviewed the triage vital signs and the nursing notes.  Pertinent labs & imaging results that were available during my care of the patient were reviewed by me and considered in my medical decision making (see chart for details).    Vaginitis, vaginal cytology pending .  Urine hCG negative UA negative Will trial a course of metronidazole while waiting for cytology. Triamcinolone cream for skin eruption along with levocetirizine.  Encouraged to launder bedding. Final Clinical Impressions(s) / UC Diagnoses   Final diagnoses:  Vaginitis and vulvovaginitis  Rash and nonspecific skin eruption     Discharge Instructions      Vaginal cytology will result within 1 to 2 days.   If any additional treatment is warranted we will notify you by phone and sent any necessary medications to the pharmacy.     ED Prescriptions     Medication Sig Dispense Auth. Provider   metroNIDAZOLE (FLAGYL) 500 MG tablet Take 1 tablet (500 mg total) by mouth 2 (two) times daily. 14 tablet Scot Jun,  FNP   triamcinolone (KENALOG) 0.025 % ointment Apply 1 application topically 2 (two) times daily. 454 g Scot Jun, FNP   levocetirizine (XYZAL) 5 MG tablet Take 1 tablet (5 mg total) by mouth every evening for 14 days. 14 tablet Scot Jun, FNP      PDMP not reviewed this encounter.   Scot Jun, [place] 03/24/21 [phone]

## 2021-03-24 NOTE — Discharge Instructions (Signed)
Vaginal cytology will result within 1 to 2 days.  If any additional treatment is warranted we will notify you by phone and sent any necessary medications to the pharmacy.

## 2021-03-24 NOTE — ED Triage Notes (Signed)
Pt presents with possible bug bites on bilateral arms and legs x 1 week. Pt also has vaginal odor x 2 weeks she has tried diflucan and OTC medication but has not helped with sxs.

## 2021-03-25 LAB — CERVICOVAGINAL ANCILLARY ONLY
Bacterial Vaginitis (gardnerella): POSITIVE — AB
Candida Glabrata: NEGATIVE
Candida Vaginitis: NEGATIVE
Chlamydia: NEGATIVE
Comment: NEGATIVE
Comment: NEGATIVE
Comment: NEGATIVE
Comment: NEGATIVE
Comment: NEGATIVE
Comment: NORMAL
Neisseria Gonorrhea: NEGATIVE
Trichomonas: NEGATIVE

## 2021-03-29 ENCOUNTER — Other Ambulatory Visit: Payer: Self-pay | Admitting: Obstetrics and Gynecology

## 2021-03-31 ENCOUNTER — Ambulatory Visit: Payer: Self-pay

## 2021-04-02 ENCOUNTER — Encounter: Payer: Self-pay | Admitting: Obstetrics and Gynecology

## 2021-04-04 DIAGNOSIS — N870 Mild cervical dysplasia: Secondary | ICD-10-CM | POA: Insufficient documentation

## 2021-04-04 NOTE — Progress Notes (Signed)
PCP:  [name], MD   Chief Complaint  Patient presents with   Breast Exam    Lump on RB x 1 week and a half   Abnormal Pap Smear          HPI:      [name] is a 30 y.o. G0P0000 whose LMP was Patient's last menstrual period was 03/18/2021 (approximate)., presents today for her annual examination.  Her menses are regular every 28-30 days, lasting 7 days.  Dysmenorrhea mild, occurring first 1-2 days of flow. She does not have intermenstrual bleeding.  Sex activity: single partner, contraception - Ortho-Evra patches weekly.  Last Pap: 02/20/20 ASCUS/POS HPV DNA; colpo bx 12/21 with CIN 1 with Dr. Gilman Schmidt, repeat pap due in 1 yr. Hx of STDs: HPV  Last mammogram: never; discussed starting this yr due to [place] breast cancer/increased risk but not done yet.  Pt has noticed RT breast mass for the past 1 1/2 wks. No change in size since first noticed, not tender. Does SBE and this is different. No trauma/erythema/nipple d/c. No hx of breast masses. Drinks caffeine daily and has increased recently. Is s/p breast reduction. Pos FH breast cancer in her pat uant, ovarian cancer in her MGM. She is MyRisk neg for FH breast cancer, but at increased risk of breast cancer.   Tobacco use: The patient denies current or previous tobacco use. Alcohol use: social drinker No drug use.  Exercise: moderately active   Patient Active Problem List   Diagnosis Date Noted   Dysplasia of cervix, low grade (CIN 1) 04/04/2021   Family history of breast cancer 02/24/2019   Increased risk of breast cancer 03/12/2018   Gonorrhea 02/20/2018   S/P bilateral breast reduction 05/19/2016   Macromastia 01/31/2016   Back pain 01/30/2012    Past Surgical History:  Procedure Laterality Date   BREAST REDUCTION SURGERY  04/2016   ESOPHAGOGASTRODUODENOSCOPY (EGD) WITH PROPOFOL N/A 09/28/2020   Procedure: ESOPHAGOGASTRODUODENOSCOPY (EGD) WITH PROPOFOL;  Surgeon: Lesly Rubenstein, MD;   Location: [facility] ENDOSCOPY;  Service: Endoscopy;  Laterality: N/A;    Family History  Problem Relation Age of Onset   Schizophrenia Mother    Autism Brother    Heart disease Maternal Grandfather    Breast cancer Paternal Aunt 62   Ovarian cancer Maternal Grandmother 39       "female cancer" ? primary site--extensive spread    Social History   Socioeconomic History   Marital status: Single    Spouse name: Not on file   Number of children: Not on file   Years of education: Not on file   Highest education level: Not on file  Occupational History   Not on file  Tobacco Use   Smoking status: Never   Smokeless tobacco: Never  Vaping Use   Vaping Use: Never used  Substance and Sexual Activity   Alcohol use: Yes    Comment: socially   Drug use: No   Sexual activity: Yes    Birth control/protection: Patch, Condom  Other Topics Concern   Not on file  Social History Narrative   Not on file   Social Determinants of Health   Financial Resource Strain: Not on file  Food Insecurity: Not on file  Transportation Needs: Not on file  Physical Activity: Not on file  Stress: Not on file  Social Connections: Not on file  Intimate Partner Violence: Not on file     Current Outpatient Medications:  famotidine (PEPCID) 20 MG tablet, Take 1 tablet by mouth 2 (two) times daily., Disp: , Rfl:    hyoscyamine (LEVSIN SL) 0.125 MG SL tablet, PLEASE SEE ATTACHED FOR DETAILED DIRECTIONS, Disp: , Rfl:    levocetirizine (XYZAL) 5 MG tablet, Take 1 tablet (5 mg total) by mouth every evening for 14 days., Disp: 14 tablet, Rfl: 0   triamcinolone (KENALOG) 0.025 % ointment, Apply 1 application topically 2 (two) times daily., Disp: 454 g, Rfl: 0   norelgestromin-ethinyl estradiol (XULANE) 150-35 MCG/24HR transdermal patch, Apply 1 patch weekly for 3 weeks, then 1 week without patch, Disp: 9 patch, Rfl: 3     ROS: Review of Systems  Constitutional:  Negative for fatigue, fever and unexpected  weight change.  Respiratory:  Negative for cough, shortness of breath and wheezing.   Cardiovascular:  Negative for chest pain, palpitations and leg swelling.  Gastrointestinal:  Negative for blood in stool, constipation, diarrhea, nausea and vomiting.  Endocrine: Negative for cold intolerance, heat intolerance and polyuria.  Genitourinary:  Negative for dyspareunia, dysuria, flank pain, frequency, genital sores, hematuria, menstrual problem, pelvic pain, urgency, vaginal bleeding, vaginal discharge and vaginal pain.  Musculoskeletal:  Negative for back pain, joint swelling and myalgias.  Skin:  Negative for rash.  Neurological:  Negative for dizziness, syncope, light-headedness, numbness and headaches.  Hematological:  Negative for adenopathy.  Psychiatric/Behavioral:  Negative for agitation, confusion, sleep disturbance and suicidal ideas. The patient is not nervous/anxious.    BREAST: mass   Objective: BP 100/70   Ht 5\' 3"  (1.6 m)   Wt 156 lb (70.8 kg)   LMP 03/18/2021 (Approximate)   BMI 27.63 kg/m    Physical Exam Constitutional:      Appearance: She is well-developed.  Genitourinary:     Vulva normal.     Right Labia: No rash, tenderness or lesions.    Left Labia: No tenderness, lesions or rash.    No vaginal discharge, erythema or tenderness.      Right Adnexa: not tender and no mass present.    Left Adnexa: not tender and no mass present.    No cervical polyp.     Uterus is not enlarged or tender.  Breasts:    Breasts are symmetrical.     Right: Mass present. No inverted nipple, nipple discharge, skin change or tenderness.     Left: No inverted nipple, mass, nipple discharge, skin change or tenderness.  Neck:     Thyroid: No thyromegaly.  Pulmonary:     Effort: Pulmonary effort is normal.  Chest:    Abdominal:     Palpations: Abdomen is soft.     Tenderness: There is no abdominal tenderness. There is no guarding or rebound.  Musculoskeletal:        General:  Normal range of motion.     Cervical back: Normal range of motion.  Neurological:     General: No focal deficit present.     Mental Status: She is alert and oriented to person, place, and time.     Cranial Nerves: No cranial nerve deficit.  Skin:    General: Skin is warm and dry.  Psychiatric:        Mood and Affect: Mood normal.        Behavior: Behavior normal.        Thought Content: Thought content normal.        Judgment: Judgment normal.  Vitals reviewed.    Assessment/Plan: Encounter for annual routine gynecological examination  Cervical  cancer screening - Plan: Cytology - PAP  Screening for HPV (human papillomavirus) - Plan: Cytology - PAP  Dysplasia of cervix, low grade (CIN 1) - Plan: Cytology - PAP; repeat pap today. Will f/u with results.   Encounter for surveillance of transdermal patch hormonal contraceptive device - Plan: norelgestromin-ethinyl estradiol Burr Medico) 150-35 MCG/24HR transdermal patch; Rx RF  Mass of upper outer quadrant of right breast - Plan: US BREAST LTD UNI RIGHT INC AXILLA, MM DIAG BREAST TOMO BILATERAL, US BREAST LTD UNI LEFT INC AXILLA; check dx mammo and u/s. Pt to call to sched. Will f/u with results. May be overlapping tissue.   Family history of breast cancer - Plan: US BREAST LTD UNI RIGHT INC AXILLA, MM DIAG BREAST TOMO BILATERAL, US BREAST LTD UNI LEFT INC AXILLA; MyRisk neg.   Increased risk of breast cancer - Plan: US BREAST LTD UNI RIGHT INC AXILLA, MM DIAG BREAST TOMO BILATERAL, US BREAST LTD UNI LEFT INC AXILLA  Meds ordered this encounter  Medications   norelgestromin-ethinyl estradiol Burr Medico) 150-35 MCG/24HR transdermal patch    Sig: Apply 1 patch weekly for 3 weeks, then 1 week without patch    Dispense:  9 patch    Refill:  3    Order Specific Question:   Supervising Provider    Answer:   Nadara Mustard [[id number]]             GYN counsel breast self exam, mammography screening    F/U  Return in about 1 year (around  04/05/2022) for annual.  Illiana Losurdo B. Betania Dizon, PA-C 04/05/2021 4:05 PM

## 2021-04-05 ENCOUNTER — Other Ambulatory Visit: Payer: Self-pay

## 2021-04-05 ENCOUNTER — Ambulatory Visit (INDEPENDENT_AMBULATORY_CARE_PROVIDER_SITE_OTHER): Payer: 59 | Admitting: Obstetrics and Gynecology

## 2021-04-05 ENCOUNTER — Other Ambulatory Visit (HOSPITAL_COMMUNITY)
Admission: RE | Admit: 2021-04-05 | Discharge: 2021-04-05 | Disposition: A | Discharge status: Home / Self Care | Payer: 59 | Source: Ambulatory Visit | Attending: Obstetrics and Gynecology | Admitting: Obstetrics and Gynecology

## 2021-04-05 ENCOUNTER — Encounter: Payer: Self-pay | Admitting: Obstetrics and Gynecology

## 2021-04-05 VITALS — BP 100/70 | Ht 63.0 in | Wt 156.0 lb

## 2021-04-05 DIAGNOSIS — Z1151 Encounter for screening for human papillomavirus (HPV): Secondary | ICD-10-CM

## 2021-04-05 DIAGNOSIS — Z3045 Encounter for surveillance of transdermal patch hormonal contraceptive device: Secondary | ICD-10-CM | POA: Diagnosis not present

## 2021-04-05 DIAGNOSIS — Z9189 Other specified personal risk factors, not elsewhere classified: Secondary | ICD-10-CM | POA: Diagnosis not present

## 2021-04-05 DIAGNOSIS — Z124 Encounter for screening for malignant neoplasm of cervix: Secondary | ICD-10-CM | POA: Insufficient documentation

## 2021-04-05 DIAGNOSIS — Z01419 Encounter for gynecological examination (general) (routine) without abnormal findings: Secondary | ICD-10-CM | POA: Diagnosis not present

## 2021-04-05 DIAGNOSIS — N870 Mild cervical dysplasia: Secondary | ICD-10-CM | POA: Diagnosis not present

## 2021-04-05 DIAGNOSIS — R928 Other abnormal and inconclusive findings on diagnostic imaging of breast: Secondary | ICD-10-CM

## 2021-04-05 DIAGNOSIS — N6311 Unspecified lump in the right breast, upper outer quadrant: Secondary | ICD-10-CM | POA: Diagnosis not present

## 2021-04-05 DIAGNOSIS — Z803 Family history of malignant neoplasm of breast: Secondary | ICD-10-CM

## 2021-04-05 MED ORDER — XULANE 150-35 MCG/24HR TD PTWK: MEDICATED_PATCH | TRANSDERMAL | 3 refills | Status: DC

## 2021-04-11 LAB — CYTOLOGY - PAP
Comment: NEGATIVE
Diagnosis: NEGATIVE
Diagnosis: REACTIVE
High risk HPV: NEGATIVE

## 2021-04-13 ENCOUNTER — Ambulatory Visit
Admission: RE | Admit: 2021-04-13 | Discharge: 2021-04-13 | Disposition: A | Discharge status: Home / Self Care | Payer: 59 | Source: Ambulatory Visit | Attending: Obstetrics and Gynecology | Admitting: Obstetrics and Gynecology

## 2021-04-13 ENCOUNTER — Other Ambulatory Visit: Payer: Self-pay

## 2021-04-13 DIAGNOSIS — Z803 Family history of malignant neoplasm of breast: Secondary | ICD-10-CM

## 2021-04-13 DIAGNOSIS — Z9189 Other specified personal risk factors, not elsewhere classified: Secondary | ICD-10-CM

## 2021-04-13 DIAGNOSIS — N6311 Unspecified lump in the right breast, upper outer quadrant: Secondary | ICD-10-CM | POA: Insufficient documentation

## 2021-04-13 DIAGNOSIS — R922 Inconclusive mammogram: Secondary | ICD-10-CM | POA: Diagnosis not present

## 2021-04-13 DIAGNOSIS — N6489 Other specified disorders of breast: Secondary | ICD-10-CM | POA: Diagnosis not present

## 2021-04-14 NOTE — Addendum Note (Signed)
Addended by: Althea Grimmer B on: 04/14/2021 12:47 PM   Modules accepted: Orders

## 2021-04-21 ENCOUNTER — Ambulatory Visit: Payer: 59 | Admitting: Obstetrics and Gynecology

## 2021-10-13 ENCOUNTER — Other Ambulatory Visit: Payer: Self-pay | Admitting: Obstetrics and Gynecology

## 2021-10-13 ENCOUNTER — Ambulatory Visit
Admission: RE | Admit: 2021-10-13 | Discharge: 2021-10-13 | Disposition: A | Discharge status: Home / Self Care | Payer: 59 | Source: Ambulatory Visit | Attending: Obstetrics and Gynecology | Admitting: Obstetrics and Gynecology

## 2021-10-13 DIAGNOSIS — Z803 Family history of malignant neoplasm of breast: Secondary | ICD-10-CM

## 2021-10-13 DIAGNOSIS — N6489 Other specified disorders of breast: Secondary | ICD-10-CM | POA: Diagnosis not present

## 2021-10-13 DIAGNOSIS — R928 Other abnormal and inconclusive findings on diagnostic imaging of breast: Secondary | ICD-10-CM | POA: Insufficient documentation

## 2021-10-13 DIAGNOSIS — Z9189 Other specified personal risk factors, not elsewhere classified: Secondary | ICD-10-CM

## 2021-10-13 DIAGNOSIS — N6311 Unspecified lump in the right breast, upper outer quadrant: Secondary | ICD-10-CM

## 2022-03-10 ENCOUNTER — Other Ambulatory Visit: Payer: Self-pay

## 2022-03-10 DIAGNOSIS — Z79899 Other long term (current) drug therapy: Secondary | ICD-10-CM | POA: Diagnosis not present

## 2022-03-10 DIAGNOSIS — R41840 Attention and concentration deficit: Secondary | ICD-10-CM | POA: Diagnosis not present

## 2022-03-14 DIAGNOSIS — R41840 Attention and concentration deficit: Secondary | ICD-10-CM | POA: Diagnosis not present

## 2022-03-15 ENCOUNTER — Other Ambulatory Visit: Payer: Self-pay

## 2022-03-15 MED ORDER — SERTRALINE HCL 50 MG PO TABS
50.0000 mg | ORAL_TABLET | Freq: Every day | ORAL | 2 refills | Status: DC
  Filled 2022-03-15: qty 30, 30d supply, fill #0

## 2022-04-21 ENCOUNTER — Inpatient Hospital Stay: Admission: RE | Admit: 2022-04-21 | Payer: 59 | Source: Ambulatory Visit

## 2022-04-21 ENCOUNTER — Other Ambulatory Visit: Payer: 59

## 2022-06-19 ENCOUNTER — Ambulatory Visit: Payer: Commercial Managed Care - PPO | Admitting: Obstetrics and Gynecology

## 2022-06-19 NOTE — Progress Notes (Unsigned)
PCP:  [name], MD   No chief complaint on file.    HPI:      [name] is a 32 y.o. G0P0000 whose LMP was No LMP recorded., presents today for her annual examination.  Her menses are regular every 28-30 days, lasting 7 days.  Dysmenorrhea mild, occurring first 1-2 days of flow. She does not have intermenstrual bleeding.  Sex activity: single partner, contraception - Ortho-Evra patches weekly.  Last Pap: 04/05/21 Results were no abnormalities/neg HPV DNA. 02/20/20 ASCUS/POS HPV DNA; colpo bx 12/21 with CIN 1 with Dr. Gilman Schmidt, repeat pap due Q3 yrs per ASCCP guidelines. Hx of STDs: HPV  Last mammogram: 04/13/21   Cat 3   ****never; discussed starting this yr due to [place] breast cancer/increased risk but not done yet. F/u orders placed, pt to schedule.  Pt has noticed RT breast mass for the past 1 1/2 wks. No change in size since first noticed, not tender. Does SBE and this is different. No trauma/erythema/nipple d/c. No hx of breast masses. Drinks caffeine daily and has increased recently. Is s/p breast reduction. Pos FH breast cancer in her pat uant, ovarian cancer in her MGM. She is MyRisk neg for FH breast cancer, but at increased risk of breast cancer.   Tobacco use: The patient denies current or previous tobacco use. Alcohol use: social drinker No drug use.  Exercise: moderately active   Patient Active Problem List   Diagnosis Date Noted   Dysplasia of cervix, low grade (CIN 1) 04/04/2021   Family history of breast cancer 02/24/2019   Increased risk of breast cancer 03/12/2018   Gonorrhea 02/20/2018   S/P bilateral breast reduction 05/19/2016   Macromastia 01/31/2016   Back pain 01/30/2012    Past Surgical History:  Procedure Laterality Date   BREAST REDUCTION SURGERY  04/2016   ESOPHAGOGASTRODUODENOSCOPY (EGD) WITH PROPOFOL N/A 09/28/2020   Procedure: ESOPHAGOGASTRODUODENOSCOPY (EGD) WITH PROPOFOL;  Surgeon: Lesly Rubenstein, MD;  Location:  [facility] ENDOSCOPY;  Service: Endoscopy;  Laterality: N/A;    Family History  Problem Relation Age of Onset   Schizophrenia Mother    Autism Brother    Heart disease Maternal Grandfather    Breast cancer Paternal Aunt 64   Ovarian cancer Maternal Grandmother 53       "female cancer" ? primary site--extensive spread    Social History   Socioeconomic History   Marital status: Single    Spouse name: Not on file   Number of children: Not on file   Years of education: Not on file   Highest education level: Not on file  Occupational History   Not on file  Tobacco Use   Smoking status: Never   Smokeless tobacco: Never  Vaping Use   Vaping Use: Never used  Substance and Sexual Activity   Alcohol use: Yes    Comment: socially   Drug use: No   Sexual activity: Yes    Birth control/protection: Patch, Condom  Other Topics Concern   Not on file  Social History Narrative   Not on file   Social Determinants of Health   Financial Resource Strain: Not on file  Food Insecurity: Not on file  Transportation Needs: Not on file  Physical Activity: Not on file  Stress: Not on file  Social Connections: Not on file  Intimate Partner Violence: Not on file     Current Outpatient Medications:    famotidine (PEPCID) 20 MG tablet, Take 1 tablet by  mouth 2 (two) times daily., Disp: , Rfl:    hyoscyamine (LEVSIN SL) 0.125 MG SL tablet, PLEASE SEE ATTACHED FOR DETAILED DIRECTIONS, Disp: , Rfl:    levocetirizine (XYZAL) 5 MG tablet, Take 1 tablet (5 mg total) by mouth every evening for 14 days., Disp: 14 tablet, Rfl: 0   norelgestromin-ethinyl estradiol (XULANE) 150-35 MCG/24HR transdermal patch, Apply 1 patch weekly for 3 weeks, then 1 week without patch, Disp: 9 patch, Rfl: 3   sertraline (ZOLOFT) 50 MG tablet, Take 1 tablet (50 mg total) by mouth daily., Disp: 30 tablet, Rfl: 2   triamcinolone (KENALOG) 0.025 % ointment, Apply 1 application topically 2 (two) times daily., Disp: 454 g, Rfl:  0     ROS: Review of Systems  Constitutional:  Negative for fatigue, fever and unexpected weight change.  Respiratory:  Negative for cough, shortness of breath and wheezing.   Cardiovascular:  Negative for chest pain, palpitations and leg swelling.  Gastrointestinal:  Negative for blood in stool, constipation, diarrhea, nausea and vomiting.  Endocrine: Negative for cold intolerance, heat intolerance and polyuria.  Genitourinary:  Negative for dyspareunia, dysuria, flank pain, frequency, genital sores, hematuria, menstrual problem, pelvic pain, urgency, vaginal bleeding, vaginal discharge and vaginal pain.  Musculoskeletal:  Negative for back pain, joint swelling and myalgias.  Skin:  Negative for rash.  Neurological:  Negative for dizziness, syncope, light-headedness, numbness and headaches.  Hematological:  Negative for adenopathy.  Psychiatric/Behavioral:  Negative for agitation, confusion, sleep disturbance and suicidal ideas. The patient is not nervous/anxious.     BREAST: mass   Objective: There were no vitals taken for this visit.   Physical Exam Constitutional:      Appearance: She is well-developed.  Genitourinary:     Vulva normal.     Right Labia: No rash, tenderness or lesions.    Left Labia: No tenderness, lesions or rash.    No vaginal discharge, erythema or tenderness.      Right Adnexa: not tender and no mass present.    Left Adnexa: not tender and no mass present.    No cervical polyp.     Uterus is not enlarged or tender.  Breasts:    Breasts are symmetrical.     Right: Mass present. No inverted nipple, nipple discharge, skin change or tenderness.     Left: No inverted nipple, mass, nipple discharge, skin change or tenderness.  Neck:     Thyroid: No thyromegaly.  Pulmonary:     Effort: Pulmonary effort is normal.  Chest:    Abdominal:     Palpations: Abdomen is soft.     Tenderness: There is no abdominal tenderness. There is no guarding or  rebound.  Musculoskeletal:        General: Normal range of motion.     Cervical back: Normal range of motion.  Neurological:     General: No focal deficit present.     Mental Status: She is alert and oriented to person, place, and time.     Cranial Nerves: No cranial nerve deficit.  Skin:    General: Skin is warm and dry.  Psychiatric:        Mood and Affect: Mood normal.        Behavior: Behavior normal.        Thought Content: Thought content normal.        Judgment: Judgment normal.  Vitals reviewed.     Assessment/Plan: Encounter for annual routine gynecological examination  Cervical cancer screening - Plan: Cytology -  PAP  Screening for HPV (human papillomavirus) - Plan: Cytology - PAP  Dysplasia of cervix, low grade (CIN 1) - Plan: Cytology - PAP; repeat pap today. Will f/u with results.   Encounter for surveillance of transdermal patch hormonal contraceptive device - Plan: norelgestromin-ethinyl estradiol Marilu Favre) 150-35 MCG/24HR transdermal patch; Rx RF  Mass of upper outer quadrant of right breast - Plan: US BREAST LTD UNI RIGHT INC AXILLA, MM DIAG BREAST TOMO BILATERAL, US BREAST LTD UNI LEFT INC AXILLA; check dx mammo and u/s. Pt to call to sched. Will f/u with results. May be overlapping tissue.   Family history of breast cancer - Plan: US BREAST LTD UNI RIGHT INC AXILLA, MM DIAG BREAST TOMO BILATERAL, US BREAST LTD UNI LEFT INC AXILLA; MyRisk neg.   Increased risk of breast cancer - Plan: US BREAST LTD UNI RIGHT INC AXILLA, MM DIAG BREAST TOMO BILATERAL, US BREAST LTD UNI LEFT INC AXILLA  No orders of the defined types were placed in this encounter.            GYN counsel breast self exam, mammography screening    F/U  No follow-ups on file.  Adaleena Mooers B. Isaac Lacson, PA-C 06/19/2022 7:48 PM

## 2022-06-20 ENCOUNTER — Encounter: Payer: Self-pay | Admitting: Obstetrics and Gynecology

## 2022-06-20 ENCOUNTER — Ambulatory Visit (INDEPENDENT_AMBULATORY_CARE_PROVIDER_SITE_OTHER): Payer: 59 | Admitting: Obstetrics and Gynecology

## 2022-06-20 ENCOUNTER — Other Ambulatory Visit: Payer: Self-pay

## 2022-06-20 ENCOUNTER — Other Ambulatory Visit (HOSPITAL_COMMUNITY)
Admission: RE | Admit: 2022-06-20 | Discharge: 2022-06-20 | Disposition: A | Discharge status: Home / Self Care | Payer: 59 | Source: Ambulatory Visit | Attending: Obstetrics and Gynecology | Admitting: Obstetrics and Gynecology

## 2022-06-20 VITALS — BP 110/70 | Ht 63.0 in | Wt 174.0 lb

## 2022-06-20 DIAGNOSIS — Z113 Encounter for screening for infections with a predominantly sexual mode of transmission: Secondary | ICD-10-CM | POA: Insufficient documentation

## 2022-06-20 DIAGNOSIS — N898 Other specified noninflammatory disorders of vagina: Secondary | ICD-10-CM | POA: Diagnosis not present

## 2022-06-20 DIAGNOSIS — Z30011 Encounter for initial prescription of contraceptive pills: Secondary | ICD-10-CM

## 2022-06-20 DIAGNOSIS — Z124 Encounter for screening for malignant neoplasm of cervix: Secondary | ICD-10-CM | POA: Diagnosis not present

## 2022-06-20 DIAGNOSIS — Z1151 Encounter for screening for human papillomavirus (HPV): Secondary | ICD-10-CM | POA: Diagnosis not present

## 2022-06-20 DIAGNOSIS — R350 Frequency of micturition: Secondary | ICD-10-CM | POA: Diagnosis not present

## 2022-06-20 DIAGNOSIS — Z01419 Encounter for gynecological examination (general) (routine) without abnormal findings: Secondary | ICD-10-CM

## 2022-06-20 DIAGNOSIS — Z803 Family history of malignant neoplasm of breast: Secondary | ICD-10-CM

## 2022-06-20 DIAGNOSIS — Z3045 Encounter for surveillance of transdermal patch hormonal contraceptive device: Secondary | ICD-10-CM

## 2022-06-20 DIAGNOSIS — Z9189 Other specified personal risk factors, not elsewhere classified: Secondary | ICD-10-CM

## 2022-06-20 DIAGNOSIS — B9689 Other specified bacterial agents as the cause of diseases classified elsewhere: Secondary | ICD-10-CM | POA: Insufficient documentation

## 2022-06-20 DIAGNOSIS — Z1231 Encounter for screening mammogram for malignant neoplasm of breast: Secondary | ICD-10-CM

## 2022-06-20 LAB — POCT WET PREP WITH KOH
Clue Cells Wet Prep HPF POC: NEGATIVE
KOH Prep POC: NEGATIVE
Trichomonas, UA: NEGATIVE
Yeast Wet Prep HPF POC: NEGATIVE

## 2022-06-20 LAB — POCT URINALYSIS DIPSTICK
Bilirubin, UA: NEGATIVE
Blood, UA: NEGATIVE
Glucose, UA: NEGATIVE
Ketones, UA: NEGATIVE
Leukocytes, UA: NEGATIVE
Nitrite, UA: NEGATIVE
Protein, UA: NEGATIVE
Spec Grav, UA: 1.01
pH, UA: 6

## 2022-06-20 MED ORDER — DROSPIRENONE-ETHINYL ESTRADIOL 3-0.02 MG PO TABS
1.0000 | ORAL_TABLET | Freq: Every day | ORAL | 3 refills | Status: DC
  Filled 2022-06-20: qty 84, 84d supply, fill #0
  Filled 2022-09-10: qty 84, 84d supply, fill #1

## 2022-06-20 NOTE — Patient Instructions (Signed)
I value your feedback and you entrusting us with your care. If you get a [facility] patient survey, I would appreciate you taking the time to let us know about your experience today. Thank you!  [facility]: [phone]      

## 2022-06-22 LAB — CERVICOVAGINAL ANCILLARY ONLY
Bacterial Vaginitis (gardnerella): NEGATIVE
Candida Glabrata: NEGATIVE
Candida Vaginitis: NEGATIVE
Chlamydia: NEGATIVE
Comment: NEGATIVE
Comment: NEGATIVE
Comment: NEGATIVE
Comment: NEGATIVE
Comment: NEGATIVE
Comment: NORMAL
Neisseria Gonorrhea: NEGATIVE
Trichomonas: NEGATIVE

## 2022-06-22 LAB — URINE CULTURE

## 2022-06-28 LAB — CYTOLOGY - PAP
Adequacy: ABSENT
Comment: NEGATIVE
Diagnosis: NEGATIVE
High risk HPV: NEGATIVE

## 2022-08-15 ENCOUNTER — Other Ambulatory Visit: Payer: Self-pay

## 2022-08-15 DIAGNOSIS — M25511 Pain in right shoulder: Secondary | ICD-10-CM | POA: Diagnosis not present

## 2022-08-15 DIAGNOSIS — K219 Gastro-esophageal reflux disease without esophagitis: Secondary | ICD-10-CM | POA: Diagnosis not present

## 2022-08-15 MED ORDER — MELOXICAM 15 MG PO TABS
15.0000 mg | ORAL_TABLET | Freq: Every day | ORAL | 0 refills | Status: DC
  Filled 2022-08-15: qty 30, 30d supply, fill #0

## 2022-09-11 ENCOUNTER — Other Ambulatory Visit: Payer: Self-pay

## 2023-01-08 ENCOUNTER — Encounter: Payer: Self-pay | Admitting: Obstetrics and Gynecology

## 2023-01-08 MED ORDER — METRONIDAZOLE 500 MG PO TABS: ORAL_TABLET | ORAL | 0 refills | Status: DC

## 2023-01-23 ENCOUNTER — Telehealth: Payer: Self-pay | Admitting: Obstetrics and Gynecology

## 2023-01-23 MED ORDER — FLUCONAZOLE 150 MG PO TABS: 150.0000 mg | ORAL_TABLET | Freq: Once | ORAL | 0 refills | Status: AC

## 2023-01-23 NOTE — Telephone Encounter (Signed)
Rx diflucan for yeast vag after BV tx

## 2023-06-27 IMAGING — US US BREAST*R* LIMITED INC AXILLA
1 series · 2 of 2 positions shown · non-contrast
Comparison: None.

CLINICAL DATA: 30-year-old female presenting with a new lump in the
right breast for approximately 3 weeks. Patient has a history of
bilateral breast reduction surgery.



[Series 1: us breast*right* limited inc axilla · 0.07mm/px · 2 of 2 slices shown]
[im 1/2]
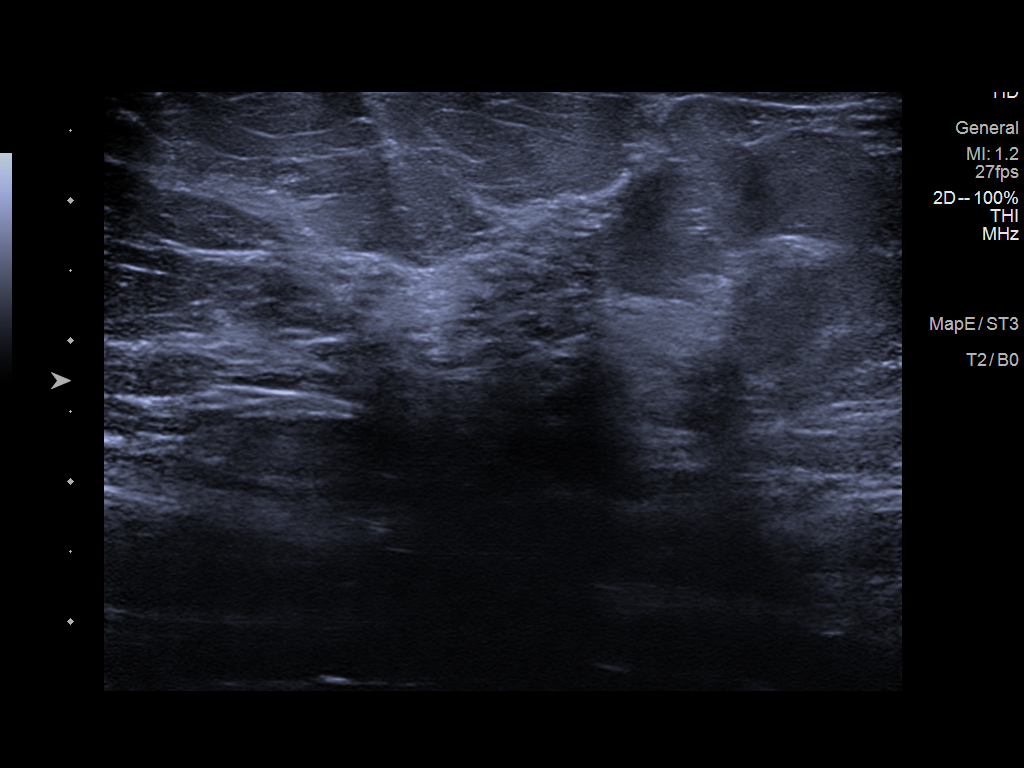
[im 2/2]
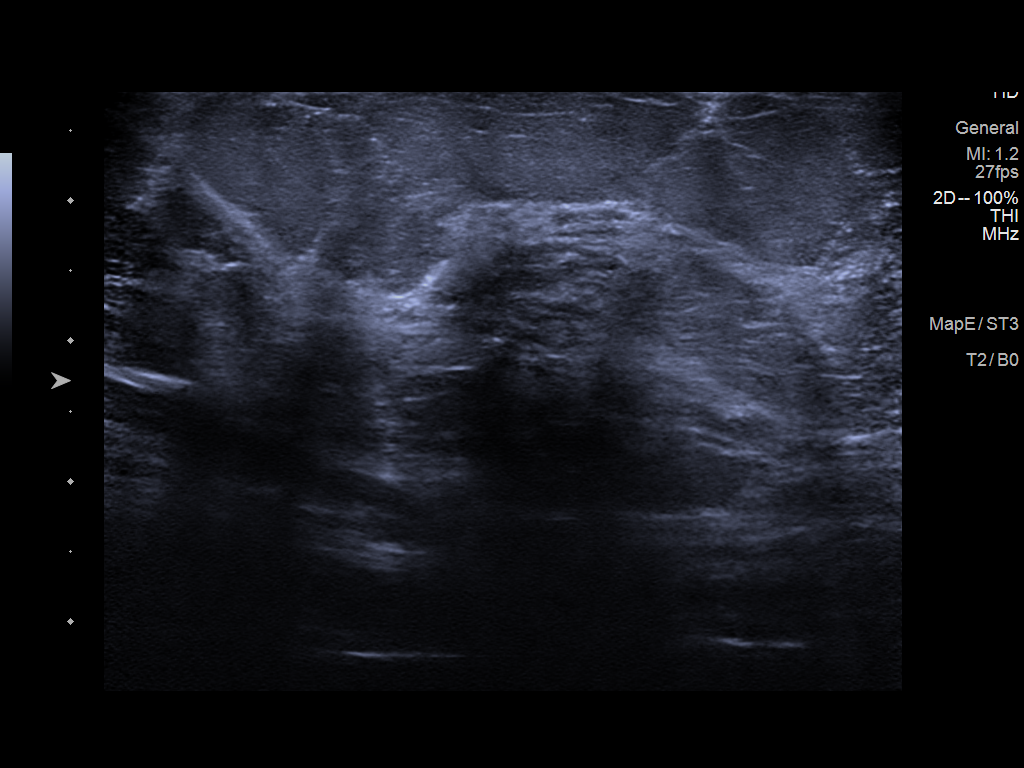

[2 of 2 positions shown; findings below may reference images not displayed]

ACR Breast Density Category c: The breast tissue is heterogeneously
dense, which may obscure small masses.
FINDINGS: Mammogram:

Right breast: A skin BB marks the palpable site of concern reported
by the patient in the upper outer right breast a spot tangential
view of this area was performed in addition to standard views. There
is an asymmetry, likely an island of normal fibroglandular tissue at
the palpable site. No suspicious findings are identified elsewhere
in the right breast.

Left breast: Spot tomosynthesis and spot 2D magnification views of
the left breast performed for an asymmetry with calcifications in
the outer left breast. On the additional imaging there is
persistence of an asymmetry which may represent scar tissue or a
band of normal fibroglandular tissue. There are so seated loosely
grouped punctate calcifications which are better seen on the
tomosynthesis views. There are no additional findings elsewhere in
the left breast.

On physical exam at the palpable site of concern reported by the
patient in the right breast I feel a discrete area, possibly ridges
of tissue.

Ultrasound:

Right breast:

Targeted ultrasound performed at the palpable site of concern
reported by the patient in the right breast at 10 o'clock 6 cm from
the nipple demonstrating normal dense fibroglandular tissue. There
is no suspicious cystic or solid mass.

Left breast:

Targeted ultrasound performed throughout the outer aspect of the
left breast demonstrating dense tissue without a cystic or solid
mass.
IMPRESSION: 1. No mammographic evidence of malignancy or other imaging
abnormality at the palpable site reported by the patient in the
upper outer right breast.

2. Probably benign asymmetry with associated calcifications in the
outer left breast, without sonographic correlate.

RECOMMENDATION:
1. Recommend any further workup of the palpable site in the right
breast be on a clinical basis.

2.  Diagnostic left breast mammogram in 6 months.

I have discussed the findings and recommendations with the patient
who agrees to short-term follow-up. If applicable, a reminder letter
will be sent to the patient regarding the next appointment.

BI-RADS CATEGORY  3: Probably benign.

## 2023-06-27 IMAGING — US US BREAST*L* LIMITED INC AXILLA
1 series · 6 of 6 positions shown · non-contrast
Comparison: None.

CLINICAL DATA: 30-year-old female presenting with a new lump in the
right breast for approximately 3 weeks. Patient has a history of
bilateral breast reduction surgery.



[Series 1: us breast*left* limited inc axilla · 0.07mm/px · 6 of 6 slices shown]
[im 1/6]
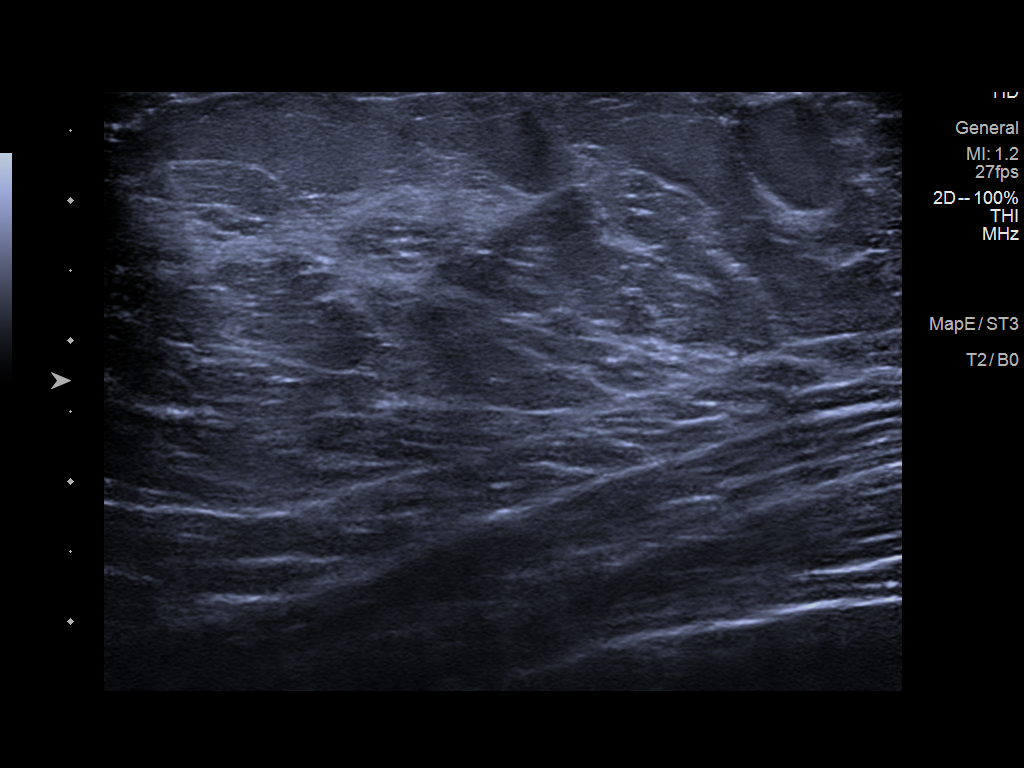
[im 2/6]
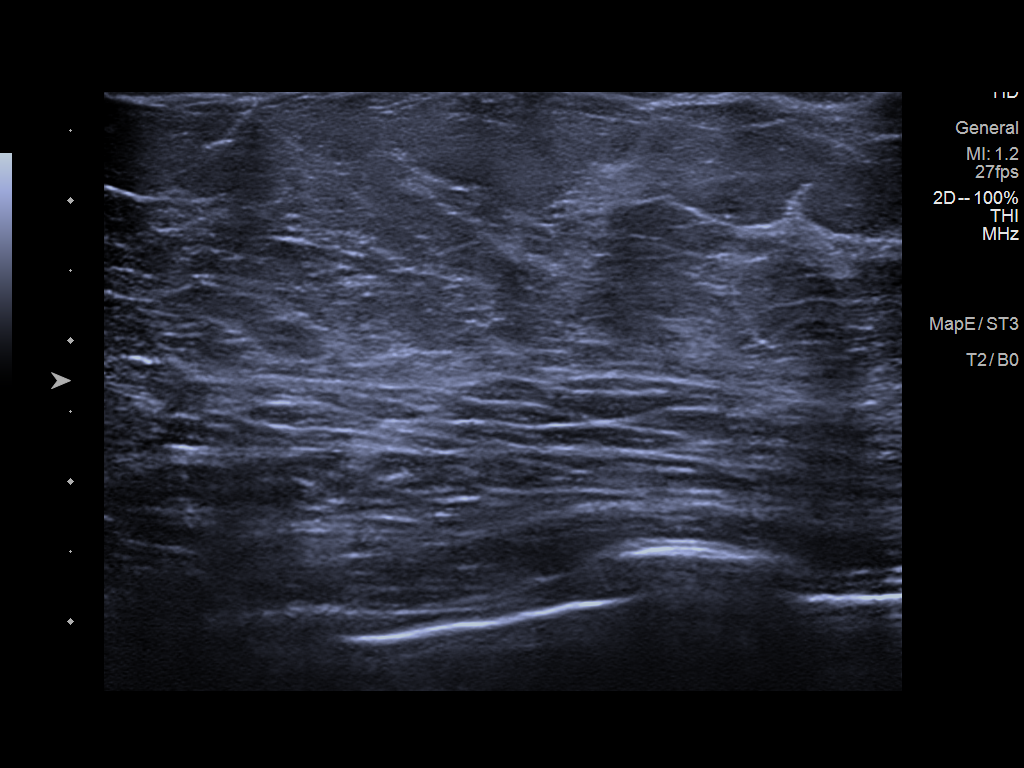
[im 3/6]
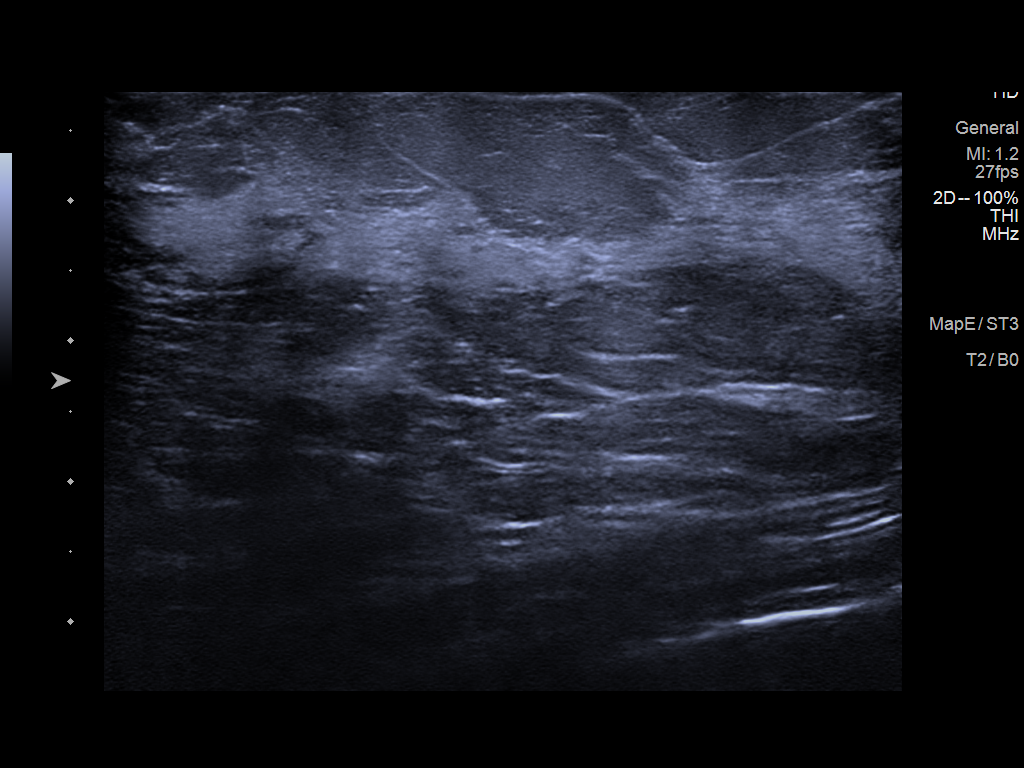
[im 4/6]
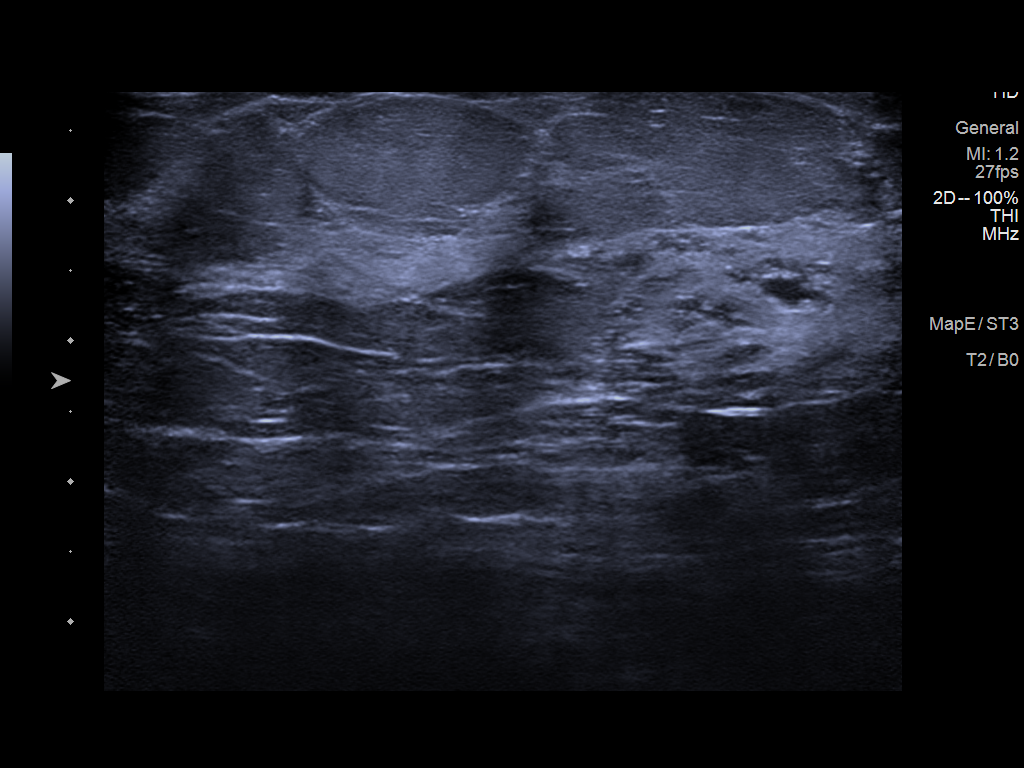
[im 5/6]
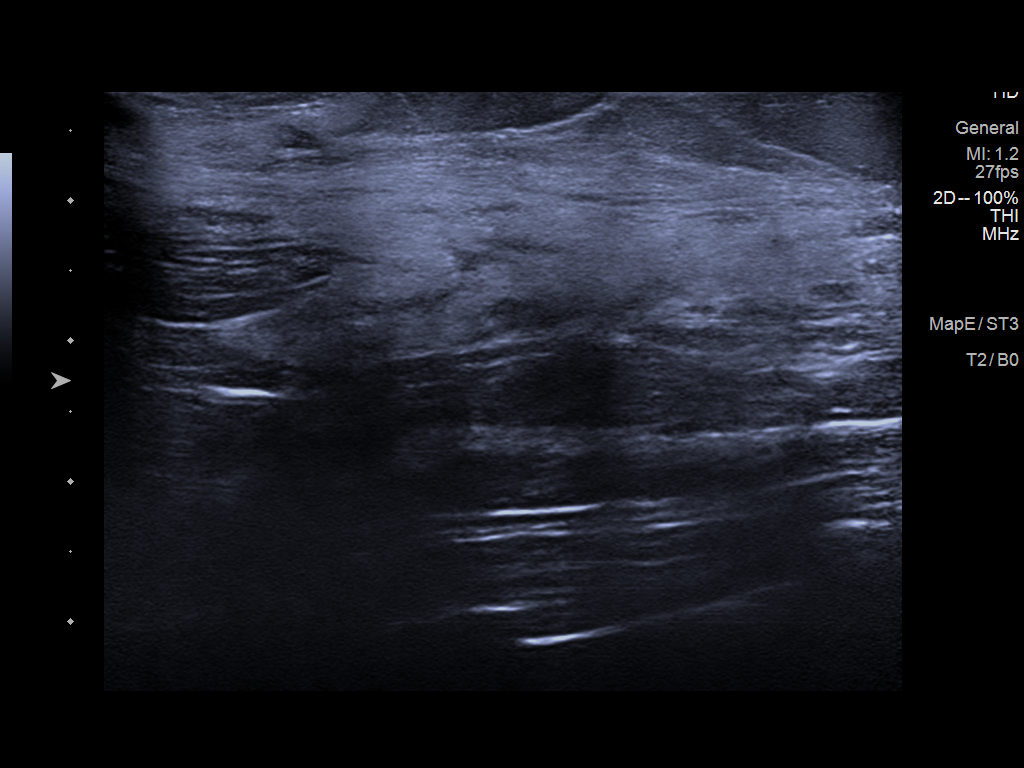
[im 6/6]
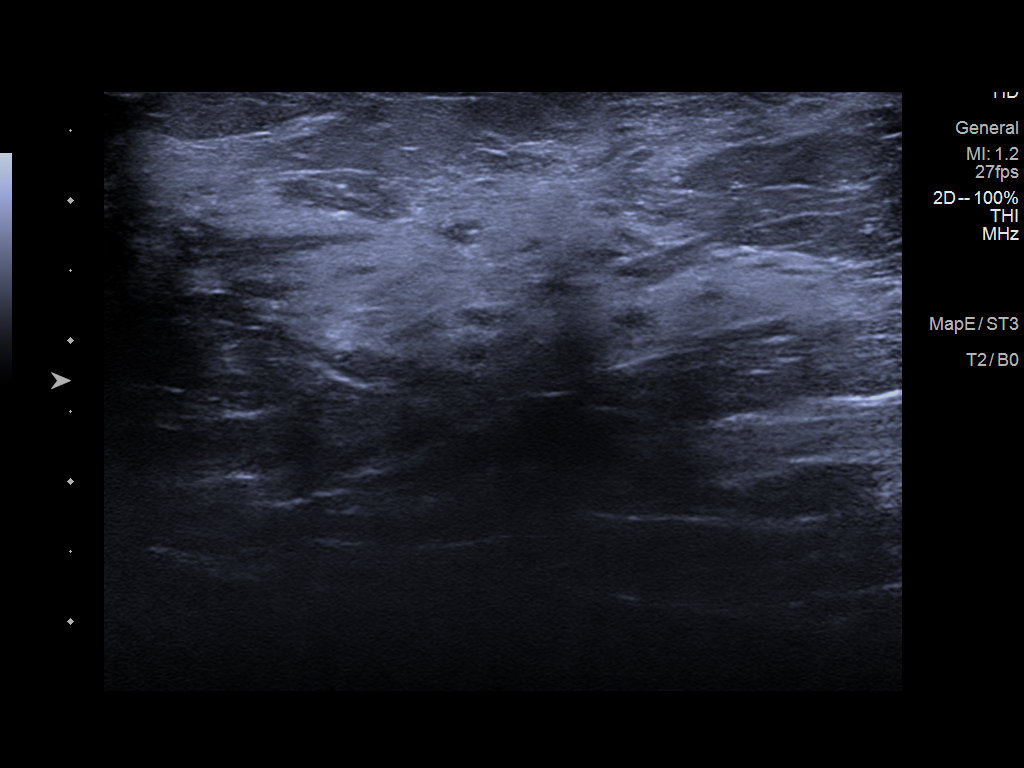

[6 of 6 positions shown; findings below may reference images not displayed]

ACR Breast Density Category c: The breast tissue is heterogeneously
dense, which may obscure small masses.
FINDINGS: Mammogram:

Right breast: A skin BB marks the palpable site of concern reported
by the patient in the upper outer right breast a spot tangential
view of this area was performed in addition to standard views. There
is an asymmetry, likely an island of normal fibroglandular tissue at
the palpable site. No suspicious findings are identified elsewhere
in the right breast.

Left breast: Spot tomosynthesis and spot 2D magnification views of
the left breast performed for an asymmetry with calcifications in
the outer left breast. On the additional imaging there is
persistence of an asymmetry which may represent scar tissue or a
band of normal fibroglandular tissue. There are so seated loosely
grouped punctate calcifications which are better seen on the
tomosynthesis views. There are no additional findings elsewhere in
the left breast.

On physical exam at the palpable site of concern reported by the
patient in the right breast I feel a discrete area, possibly ridges
of tissue.

Ultrasound:

Right breast:

Targeted ultrasound performed at the palpable site of concern
reported by the patient in the right breast at 10 o'clock 6 cm from
the nipple demonstrating normal dense fibroglandular tissue. There
is no suspicious cystic or solid mass.

Left breast:

Targeted ultrasound performed throughout the outer aspect of the
left breast demonstrating dense tissue without a cystic or solid
mass.
IMPRESSION: 1. No mammographic evidence of malignancy or other imaging
abnormality at the palpable site reported by the patient in the
upper outer right breast.

2. Probably benign asymmetry with associated calcifications in the
outer left breast, without sonographic correlate.

RECOMMENDATION:
1. Recommend any further workup of the palpable site in the right
breast be on a clinical basis.

2.  Diagnostic left breast mammogram in 6 months.

I have discussed the findings and recommendations with the patient
who agrees to short-term follow-up. If applicable, a reminder letter
will be sent to the patient regarding the next appointment.

BI-RADS CATEGORY  3: Probably benign.

## 2023-08-02 ENCOUNTER — Ambulatory Visit
Admission: EM | Admit: 2023-08-02 | Discharge: 2023-08-02 | Disposition: A | Discharge status: Home / Self Care | Attending: Emergency Medicine | Admitting: Emergency Medicine

## 2023-08-02 DIAGNOSIS — J302 Other seasonal allergic rhinitis: Secondary | ICD-10-CM

## 2023-08-02 DIAGNOSIS — R051 Acute cough: Secondary | ICD-10-CM

## 2023-08-02 DIAGNOSIS — J029 Acute pharyngitis, unspecified: Secondary | ICD-10-CM | POA: Diagnosis not present

## 2023-08-02 LAB — POC COVID19/FLU A&B COMBO
Covid Antigen, POC: NEGATIVE
Influenza A Antigen, POC: NEGATIVE
Influenza B Antigen, POC: NEGATIVE

## 2023-08-02 LAB — POCT RAPID STREP A (OFFICE): Rapid Strep A Screen: NEGATIVE

## 2023-08-02 NOTE — ED Provider Notes (Signed)
 Renaldo Fiddler    CSN: [id number] Arrival date & time: 08/02/23  1218      History   Chief Complaint No chief complaint on file.   HPI Traci Flowers is a 33 y.o. female.   Patient presents with 2-day history of congestion, postnasal drip, sore throat, cough, headache.  Treatment attempted with Zyrtec, Claritin, Flonase.  No fever, shortness of breath, vomiting, diarrhea.  The history is provided by the patient and medical records.    Past Medical History:  Diagnosis Date   BRCA negative 02/2018   MyRisk neg except MSH3 VUS   BV (bacterial vaginosis)    Family history of breast cancer    GERD (gastroesophageal reflux disease)    Gonorrhea    Increased risk of breast cancer 02/2018   IBIS=21%   UTI (lower urinary tract infection)     Patient Active Problem List   Diagnosis Date Noted   BV (bacterial vaginosis) 06/20/2022   Dysplasia of cervix, low grade (CIN 1) 04/04/2021   Family history of breast cancer 02/24/2019   Increased risk of breast cancer 03/12/2018   Gonorrhea 02/20/2018   S/P bilateral breast reduction 05/19/2016   Macromastia 01/31/2016   Back pain 01/30/2012    Past Surgical History:  Procedure Laterality Date   BREAST REDUCTION SURGERY  04/2016   ESOPHAGOGASTRODUODENOSCOPY (EGD) WITH PROPOFOL N/A 09/28/2020   Procedure: ESOPHAGOGASTRODUODENOSCOPY (EGD) WITH PROPOFOL;  Surgeon: Regis Bill, MD;  Location: [facility] ENDOSCOPY;  Service: Endoscopy;  Laterality: N/A;    OB History     Gravida  0   Para  0   Term  0   Preterm  0   AB  0   Living  0      SAB  0   IAB  0   Ectopic  0   Multiple  0   Live Births  0            Home Medications    Prior to Admission medications   Medication Sig Start Date End Date Taking? Authorizing Provider  drospirenone-ethinyl estradiol (YAZ) 3-0.02 MG tablet Take 1 tablet by mouth daily. 06/20/22   Copland, Ilona Sorrel, PA-C  famotidine (PEPCID) 20 MG tablet Take 1 tablet  by mouth 2 (two) times daily. 07/21/20 07/21/21  [provider]  hyoscyamine (LEVSIN SL) 0.125 MG SL tablet PLEASE SEE ATTACHED FOR DETAILED DIRECTIONS 07/21/20   [provider]  meloxicam (MOBIC) 15 MG tablet Take 1 tablet (15 mg total) by mouth daily. 08/15/22     metroNIDAZOLE (FLAGYL) 500 MG tablet Take 1 tab BID for 7 days; NO alcohol use for 10 days after prescription start 01/08/23   Copland, Ilona Sorrel, PA-C    Family History Family History  Problem Relation Age of Onset   Schizophrenia Mother    Autism Brother    Heart disease Maternal Grandfather    Breast cancer Paternal Aunt 63   Ovarian cancer Maternal Grandmother 77       "female cancer" ? primary site--extensive spread    Social History Social History   Tobacco Use   Smoking status: Never   Smokeless tobacco: Never  Vaping Use   Vaping status: Never Used  Substance Use Topics   Alcohol use: Yes    Comment: socially   Drug use: No     Allergies   Avocado oil [avocado]   Review of Systems Review of Systems  Constitutional:  Negative for chills and fever.  HENT:  Positive for congestion, postnasal drip and sore throat. Negative for ear pain.   Respiratory:  Positive for cough. Negative for shortness of breath.   Gastrointestinal:  Negative for diarrhea and vomiting.  Neurological:  Positive for headaches.     Physical Exam Triage Vital Signs ED Triage Vitals [08/02/23 1229]  Encounter Vitals Group     BP 114/75     Systolic BP Percentile      Diastolic BP Percentile      Pulse Rate 80     Resp 18     Temp 97.7 F (36.5 C)     Temp src      SpO2 98 %     Weight      Height      Head Circumference      Peak Flow      Pain Score      Pain Loc      Pain Education      Exclude from Growth Chart    No data found.  Updated Vital Signs BP 114/75   Pulse 80   Temp 97.7 F (36.5 C)   Resp 18   SpO2 98%   Visual Acuity Right Eye Distance:   Left Eye Distance:   Bilateral  Distance:    Right Eye Near:   Left Eye Near:    Bilateral Near:     Physical Exam Constitutional:      General: She is not in acute distress. HENT:     Right Ear: Tympanic membrane normal.     Left Ear: Tympanic membrane normal.     Nose: Nose normal.     Mouth/Throat:     Mouth: Mucous membranes are moist.     Pharynx: Oropharynx is clear.     Comments: Clear PND Cardiovascular:     Rate and Rhythm: Normal rate and regular rhythm.     Heart sounds: Normal heart sounds.  Pulmonary:     Effort: Pulmonary effort is normal. No respiratory distress.     Breath sounds: Normal breath sounds.  Neurological:     Mental Status: She is alert.      UC Treatments / Results  Labs (all labs ordered are listed, but only abnormal results are displayed) Labs Reviewed  POCT RAPID STREP A (OFFICE)  POC COVID19/FLU A&B COMBO    EKG   Radiology No results found.  Procedures Procedures (including critical care time)  Medications Ordered in UC Medications - No data to display  Initial Impression / Assessment and Plan / UC Course  I have reviewed the triage vital signs and the nursing notes.  Pertinent labs & imaging results that were available during my care of the patient were reviewed by me and considered in my medical decision making (see chart for details).    Seasonal allergies, sore throat, cough.  Afebrile and vital signs are stable.  Lungs are clear and O2 sat is 98% on room air.  Rapid strep negative.  Rapid COVID and flu negative.  Discussed symptomatic treatment including Tylenol or ibuprofen as needed for fever or discomfort, plain Mucinex as needed for congestion, rest, hydration.  Instructed patient to follow-up with PCP if not improving.  ED precautions given.  Patient agrees to plan of care.  Final Clinical Impressions(s) / UC Diagnoses   Final diagnoses:  Seasonal allergies  Sore throat  Acute cough     Discharge Instructions      The strep, COVID and  flu tests are negative.  Take Tylenol or ibuprofen as needed for fever or discomfort.  Take plain Mucinex as needed for congestion.  Rest and keep yourself hydrated.    Follow-up with your primary care provider if your symptoms are not improving.         ED Prescriptions   None    PDMP not reviewed this encounter.   Mickie Bail, NP 08/02/23 1312

## 2023-08-02 NOTE — Discharge Instructions (Addendum)
 The strep, COVID and flu tests are negative.   Take Tylenol or ibuprofen as needed for fever or discomfort.  Take plain Mucinex as needed for congestion.  Rest and keep yourself hydrated.    Follow-up with your primary care provider if your symptoms are not improving.

## 2023-08-21 ENCOUNTER — Ambulatory Visit: Admitting: Obstetrics and Gynecology

## 2023-08-28 ENCOUNTER — Other Ambulatory Visit: Payer: Self-pay

## 2023-08-28 ENCOUNTER — Ambulatory Visit (INDEPENDENT_AMBULATORY_CARE_PROVIDER_SITE_OTHER)

## 2023-08-28 VITALS — BP 110/71 | HR 81 | Ht 63.0 in | Wt 172.0 lb

## 2023-08-28 DIAGNOSIS — Z01419 Encounter for gynecological examination (general) (routine) without abnormal findings: Secondary | ICD-10-CM | POA: Diagnosis not present

## 2023-08-28 DIAGNOSIS — Z113 Encounter for screening for infections with a predominantly sexual mode of transmission: Secondary | ICD-10-CM | POA: Diagnosis not present

## 2023-08-28 DIAGNOSIS — Z308 Encounter for other contraceptive management: Secondary | ICD-10-CM

## 2023-08-28 DIAGNOSIS — Z0000 Encounter for general adult medical examination without abnormal findings: Secondary | ICD-10-CM | POA: Diagnosis not present

## 2023-08-28 MED ORDER — NORELGESTROMIN-ETH ESTRADIOL 150-35 MCG/24HR TD PTWK
1.0000 | MEDICATED_PATCH | TRANSDERMAL | 13 refills | Status: AC
  Filled 2023-08-28: qty 3, 21d supply, fill #0
  Filled 2023-09-16: qty 3, 21d supply, fill #1
  Filled 2023-11-28: qty 3, 21d supply, fill #2

## 2023-08-28 NOTE — Assessment & Plan Note (Signed)
-   Reviewed health maintenance topics as documented below. - Most recent pap smear in 2024, repeat cervical cancer screening due 2027 per [facility] surveillance post-colposcopy . - Follow up mammogram recommended in 2022. Did not schedule at that time due to insurance issues. Order placed today, will call to schedule.  - Desires to go back to Xulane  patch. Had tried COCs earlier this month but was dissatisfied with BTB. Has been using condoms since that time. Has noted irritation when using spermicide with condoms. Discussed discontinuing use of spermicide once she starts patch. - STI screening accepted.

## 2023-08-28 NOTE — Progress Notes (Signed)
 Outpatient Gynecology Note: Annual Visit  Assessment/Plan:    Traci Flowers is a 33 y.o. female G0P0000 with normal well-woman gynecologic exam.   Well woman exam - Reviewed health maintenance topics as documented below. - Most recent pap smear in 2024, repeat cervical cancer screening due 2027 per [facility] surveillance post-colposcopy . - Follow up mammogram recommended in 2022. Did not schedule at that time due to insurance issues. Order placed today, will call to schedule.  - Desires to go back to Xulane  patch. Had tried COCs earlier this month but was dissatisfied with BTB. Has been using condoms since that time. Has noted irritation when using spermicide with condoms. Discussed discontinuing use of spermicide once she starts patch. - STI screening accepted.    Orders Placed This Encounter  Procedures   MM 3D SCREENING MAMMOGRAM BILATERAL BREAST    Standing Status:   Future    Expiration Date:   08/27/2024    Reason for Exam (SYMPTOM  OR DIAGNOSIS REQUIRED):   follow up from previous mammogram    Is the patient pregnant?:   No    Preferred imaging location?:   [facility]   HEP, RPR, HIV Panel   Hepatitis C antibody   Current Outpatient Medications  Medication Instructions   famotidine (PEPCID) 20 MG tablet 1 tablet, Oral, 2 times daily   hyoscyamine (LEVSIN SL) 0.125 MG SL tablet PLEASE SEE ATTACHED FOR DETAILED DIRECTIONS   meloxicam  (MOBIC ) 15 mg, Oral, Daily   metroNIDAZOLE  (FLAGYL ) 500 MG tablet Take 1 tab BID for 7 days; NO alcohol use for 10 days after prescription start   norelgestromin -ethinyl estradiol  (XULANE ) 150-35 MCG/24HR transdermal patch 1 patch, Transdermal, Weekly, Prescribed for continuous cycling, use patch weekly with no interruption    No follow-ups on file.    Subjective:    Traci Flowers is a 33 y.o. female G0P0000 who presents for annual wellness visit.   Occupation works as Charity fundraiser at [employer].   Lives alone.    CONCERNS?  Desires to switch to Xulane  patch. Having some irritation after using condom on Saturday. Desires STI testing.  Well Woman Visit:  GYN HISTORY:  No LMP recorded.     Menstrual History: OB History     Gravida  0   Para  0   Term  0   Preterm  0   AB  0   Living  0      SAB  0   IAB  0   Ectopic  0   Multiple  0   Live Births  0           Menarche age: 79 No LMP recorded. Period Cycle (Days): 28 Period Duration (Days): 4 Period Pattern: Regular Menstrual Flow: Moderate Menstrual Control: Maxi pad, Thin pad, Panty liner, Tampon Menstrual Control Change Freq (Hours): 2-4 Dysmenorrhea: (!) Mild Dysmenorrhea Symptoms: Cramping, Headache    Intermenstrual bleeding, spotting, or discharge? Had spotting while on Yaz Urinary incontinence? no  Sexually active: yes Number of sexual partners: two Gender of sexual Partners: male Dyspareunia? Irritation from spermicide on condoms Last pap:was normal  History of abnormal Pap: yes in 2021 Gardasil series:  no STI history: gonorrhea and HPV STI/HIV testing or immunizations needed? Yes.    Contraceptive methods: condoms  Health Maintenance > Reviewed breast self-awareness > History of abnormal mammogram: Yes > Exercise: aerobics, walking, and weightlifting, very active > Dietary Supplements: melatonin, multivitamin > Body mass index is 30.47 kg/m.  > Recent  dental visit Yes.  Working on finding a new Programmer, applications Use: Yes.   > Texting and driving? No. > Concern for alcohol abuse? Average week has about 3 drinks per week   Tobacco or other drug use: denied. Tobacco Use: Low Risk  (08/28/2023)   Patient History    Smoking Tobacco Use: Never    Smokeless Tobacco Use: Never    Passive Exposure: Not on file     PHQ-2 Score: In last two weeks, how often have you felt: Little interest or pleasure in doing things: Several days (+1) Feeling down, depressed or hopeless: Several days (+1) Score: 2, feels  stable, like she manages without need for additional resources  GAD-2 Over the last 2 weeks, how often have you been bothered by the following problems? Feeling nervous, anxious or on edge: Several days (+1) Not being able to stop or control worrying: Not at all (0)} Score: 1   _________________________________________________________  Current Outpatient Medications  Medication Sig Dispense Refill   hyoscyamine (LEVSIN SL) 0.125 MG SL tablet PLEASE SEE ATTACHED FOR DETAILED DIRECTIONS     norelgestromin -ethinyl estradiol  (XULANE ) 150-35 MCG/24HR transdermal patch Place 1 patch onto the skin once a week. Prescribed for continuous cycling, use patch weekly with no interruption 4 patch 13   famotidine (PEPCID) 20 MG tablet Take 1 tablet by mouth 2 (two) times daily.     meloxicam  (MOBIC ) 15 MG tablet Take 1 tablet (15 mg total) by mouth daily. 30 tablet 0   metroNIDAZOLE  (FLAGYL ) 500 MG tablet Take 1 tab BID for 7 days; NO alcohol use for 10 days after prescription start 14 tablet 0   No current facility-administered medications for this visit.   Allergies  Allergen Reactions   Avocado Oil [Avocado] Hives    Past Medical History:  Diagnosis Date   BRCA negative 02/2018   MyRisk neg except MSH3 VUS   BV (bacterial vaginosis)    Family history of breast cancer    GERD (gastroesophageal reflux disease)    Gonorrhea    Increased risk of breast cancer 02/2018   IBIS=21%   UTI (lower urinary tract infection)    Past Surgical History:  Procedure Laterality Date   BREAST REDUCTION SURGERY  04/2016   ESOPHAGOGASTRODUODENOSCOPY (EGD) WITH PROPOFOL  N/A 09/28/2020   Procedure: ESOPHAGOGASTRODUODENOSCOPY (EGD) WITH PROPOFOL ;  Surgeon: Shane Darling, MD;  Location: [facility] ENDOSCOPY;  Service: Endoscopy;  Laterality: N/A;   OB History     Gravida  0   Para  0   Term  0   Preterm  0   AB  0   Living  0      SAB  0   IAB  0   Ectopic  0   Multiple  0   Live Births   0          Social History   Tobacco Use   Smoking status: Never   Smokeless tobacco: Never  Substance Use Topics   Alcohol use: Yes    Comment: socially   Social History   Substance and Sexual Activity  Sexual Activity Yes   Birth control/protection: Condom, None    Immunization History  Administered Date(s) Administered   Influenza,inj,Quad PF,6+ Mos 01/09/2018   Influenza-Unspecified 01/15/2017, 01/23/2019   Tdap 04/24/2012     Review Of Systems  Constitutional: Denied constitutional symptoms, night sweats, recent illness, fatigue, fever, insomnia and weight loss.  Eyes: Denied eye symptoms, eye pain, photophobia, vision change and visual  disturbance.  Ears/Nose/Throat/Neck: Denied ear, nose, throat or neck symptoms, hearing loss, nasal discharge, sinus congestion and sore throat.  Cardiovascular: Denied cardiovascular symptoms, arrhythmia, chest pain/pressure, edema, exercise intolerance, orthopnea and palpitations.  Respiratory: Denied pulmonary symptoms, asthma, pleuritic pain, productive sputum, cough, dyspnea and wheezing.  Gastrointestinal: Denied, gastro-esophageal reflux, melena, nausea and vomiting.  Genitourinary: Denied genitourinary symptoms including symptomatic vaginal discharge, pelvic relaxation issues, and urinary complaints. + Vulvovaginal irritation after spermicide use  Musculoskeletal: Denied musculoskeletal symptoms, stiffness, swelling, muscle weakness and myalgia.  Dermatologic: Denied dermatology symptoms, rash and scar.  Neurologic: Denied neurology symptoms, dizziness, headache, neck pain and syncope.  Psychiatric: Denied psychiatric symptoms, anxiety and depression.  Endocrine: Denied endocrine symptoms including hot flashes and night sweats.      Objective:    BP 110/71   Pulse 81   Ht 5\' 3"  (1.6 m)   Wt 172 lb (78 kg)   BMI 30.47 kg/m   Constitutional: Well-developed, well-nourished female in no acute distress Neurological:  Alert and oriented to person, place, and time Psychiatric: Mood and affect appropriate Skin: No rashes or lesions Neck: Supple without masses. Trachea is midline.Thyroid is normal size without masses Lymphatics: No cervical, axillary, supraclavicular, or inguinal adenopathy noted Respiratory: Clear to auscultation bilaterally. Good air movement with normal work of breathing. Cardiovascular: Regular rate and rhythm. Extremities grossly normal, nontender with no edema; pulses regular Gastrointestinal: Soft, nontender, nondistended. No masses or hernias appreciated. No hepatosplenomegaly. No fluid wave. No rebound or guarding. Breast Exam: normal appearance, no masses or tenderness Genitourinary:         External Genitalia: Normal female genitalia Adnexae: Non-palpable and non-tender Perineum/Anus: No lesions Rectal: deferred    Fred Jacobsen, CNM  08/28/23 2:48 PM

## 2023-08-29 LAB — HEP, RPR, HIV PANEL
HIV Screen 4th Generation wRfx: NONREACTIVE
Hepatitis B Surface Ag: NEGATIVE
RPR Ser Ql: NONREACTIVE

## 2023-08-29 LAB — HEPATITIS C ANTIBODY: Hep C Virus Ab: NONREACTIVE

## 2023-08-30 LAB — CERVICOVAGINAL ANCILLARY ONLY
Bacterial Vaginitis (gardnerella): NEGATIVE
Candida Glabrata: NEGATIVE
Candida Vaginitis: NEGATIVE
Chlamydia: NEGATIVE
Comment: NEGATIVE
Comment: NEGATIVE
Comment: NEGATIVE
Comment: NEGATIVE
Comment: NEGATIVE
Comment: NORMAL
Neisseria Gonorrhea: NEGATIVE
Trichomonas: NEGATIVE

## 2023-11-05 ENCOUNTER — Other Ambulatory Visit: Payer: Self-pay | Admitting: Obstetrics and Gynecology

## 2023-11-05 DIAGNOSIS — Z1231 Encounter for screening mammogram for malignant neoplasm of breast: Secondary | ICD-10-CM

## 2023-11-05 DIAGNOSIS — N6489 Other specified disorders of breast: Secondary | ICD-10-CM

## 2023-11-17 ENCOUNTER — Emergency Department

## 2023-11-17 ENCOUNTER — Emergency Department
Admission: EM | Admit: 2023-11-17 | Discharge: 2023-11-17 | Disposition: A | Discharge status: Home / Self Care | Attending: Emergency Medicine | Admitting: Emergency Medicine

## 2023-11-17 ENCOUNTER — Other Ambulatory Visit: Payer: Self-pay

## 2023-11-17 DIAGNOSIS — R519 Headache, unspecified: Secondary | ICD-10-CM | POA: Insufficient documentation

## 2023-11-17 DIAGNOSIS — R112 Nausea with vomiting, unspecified: Secondary | ICD-10-CM | POA: Insufficient documentation

## 2023-11-17 LAB — CBC
HCT: 38.8 % (ref 36.0–46.0)
Hemoglobin: 12.3 g/dL (ref 12.0–15.0)
MCH: 26.5 pg (ref 26.0–34.0)
MCHC: 31.7 g/dL (ref 30.0–36.0)
MCV: 83.6 fL (ref 80.0–100.0)
Platelets: 274 K/uL (ref 150–400)
RBC: 4.64 MIL/uL (ref 3.87–5.11)
RDW: 13.4 % (ref 11.5–15.5)
WBC: 8.9 K/uL (ref 4.0–10.5)
nRBC: 0 % (ref 0.0–0.2)

## 2023-11-17 LAB — COMPREHENSIVE METABOLIC PANEL WITH GFR
ALT: 16 U/L (ref 0–44)
AST: 19 U/L (ref 15–41)
Albumin: 4 g/dL (ref 3.5–5.0)
Alkaline Phosphatase: 43 U/L (ref 38–126)
Anion gap: 8 (ref 5–15)
BUN: 18 mg/dL (ref 6–20)
CO2: 22 mmol/L (ref 22–32)
Calcium: 9 mg/dL (ref 8.9–10.3)
Chloride: 106 mmol/L (ref 98–111)
Creatinine, Ser: 0.67 mg/dL (ref 0.44–1.00)
GFR, Estimated: >60 mL/min (ref >60)
Glucose, Bld: 114 mg/dL — ABNORMAL HIGH (ref 70–99)
Potassium: 3.6 mmol/L (ref 3.5–5.1)
Sodium: 136 mmol/L (ref 135–145)
Total Bilirubin: 0.6 mg/dL (ref 0.0–1.2)
Total Protein: 7.4 g/dL (ref 6.5–8.1)

## 2023-11-17 LAB — LIPASE, BLOOD: Lipase: 26 U/L (ref 11–51)

## 2023-11-17 LAB — POC URINE PREG, ED: Preg Test, Ur: NEGATIVE

## 2023-11-17 MED ORDER — SODIUM CHLORIDE 0.9 % IV BOLUS
1000.0000 mL | Freq: Once | INTRAVENOUS | Status: AC
  Administered 2023-11-17: 1000 mL via INTRAVENOUS

## 2023-11-17 MED ORDER — ONDANSETRON 4 MG PO TBDP
4.0000 mg | ORAL_TABLET | Freq: Once | ORAL | Status: AC | PRN
  Administered 2023-11-17: 4 mg via ORAL
  Filled 2023-11-17: qty 1

## 2023-11-17 MED ORDER — KETOROLAC TROMETHAMINE 15 MG/ML IJ SOLN
15.0000 mg | Freq: Once | INTRAMUSCULAR | Status: AC
  Administered 2023-11-17: 15 mg via INTRAVENOUS
  Filled 2023-11-17: qty 1

## 2023-11-17 MED ORDER — DIPHENHYDRAMINE HCL 50 MG/ML IJ SOLN
25.0000 mg | Freq: Once | INTRAMUSCULAR | Status: AC
  Administered 2023-11-17: 25 mg via INTRAVENOUS
  Filled 2023-11-17: qty 1

## 2023-11-17 MED ORDER — ONDANSETRON HCL 4 MG PO TABS
4.0000 mg | ORAL_TABLET | Freq: Four times a day (QID) | ORAL | 0 refills | Status: AC | PRN
  Filled 2023-11-17: qty 20, 5d supply, fill #0

## 2023-11-17 MED ORDER — PROCHLORPERAZINE EDISYLATE 10 MG/2ML IJ SOLN
10.0000 mg | Freq: Once | INTRAMUSCULAR | Status: AC
  Administered 2023-11-17: 10 mg via INTRAVENOUS
  Filled 2023-11-17: qty 2

## 2023-11-17 NOTE — ED Notes (Addendum)
 Traci Flowers

## 2023-11-17 NOTE — ED Notes (Signed)
 Pt declined discharge vitals

## 2023-11-17 NOTE — ED Provider Notes (Signed)
 [facility] Provider Note    Event Date/Time   First MD Initiated Contact with Patient 11/17/23 1508     (approximate)   History   Headache and Emesis   HPI  Traci Flowers is a 33 year old female presenting to the emergency department for evaluation of headache and vomiting.  Around 11 or 12 today, patient had sudden onset of a headache primarily along the left side of her head and behind her eye.  Reports occasional headaches in the past often in a similar location, but usually resolve with Tylenol, not as intense.  About 45 minutes after her headache started she had onset of multiple episodes of nonbloody vomiting which she has not previously had with headaches.  Denies abdominal pain.  No new medications.  Denies marijuana use.     Physical Exam   Triage Vital Signs: ED Triage Vitals  Encounter Vitals Group     BP 11/17/23 1401 110/67     Girls Systolic BP Percentile --      Girls Diastolic BP Percentile --      Boys Systolic BP Percentile --      Boys Diastolic BP Percentile --      Pulse Rate 11/17/23 1401 (!) 54     Resp 11/17/23 1401 18     Temp 11/17/23 1401 98.5 F (36.9 C)     Temp Source 11/17/23 1401 Oral     SpO2 11/17/23 1401 100 %     Weight 11/17/23 1407 179 lb (81.2 kg)     Height 11/17/23 1407 5' 3 (1.6 m)     Head Circumference --      Peak Flow --      Pain Score 11/17/23 1403 8     Pain Loc --      Pain Education --      Exclude from Growth Chart --     Most recent vital signs: Vitals:   11/17/23 1600 11/17/23 1615  BP:    Pulse: 80 65  Resp:    Temp:    SpO2: 90% 100%     General: Awake, interactive  CV:  Mild bradycardia, normal peripheral perfusion Resp:  Unlabored respirations.  Abd:  Nondistended, soft, nontender Neuro:  Alert and oriented, normal extraocular movements, symmetric facial movement, sensation intact over bilateral upper and lower extremities with 5 out of 5 strength.  Normal  finger-to-nose testing.   ED Results / Procedures / Treatments   Labs (all labs ordered are listed, but only abnormal results are displayed) Labs Reviewed  COMPREHENSIVE METABOLIC PANEL WITH GFR - Abnormal; Notable for the following components:      Result Value   Glucose, Bld 114 (*)    All other components within normal limits  LIPASE, BLOOD  CBC  POC URINE PREG, ED     EKG EKG independently reviewed and interpreted by myself demonstrates:    RADIOLOGY Imaging independently reviewed and interpreted by myself demonstrates:  CT head without acute bleed  Formal Radiology Read:  CT Head Wo Contrast Result Date: 11/17/2023 CLINICAL DATA:  Worsening headache.  Vomiting. EXAM: CT HEAD WITHOUT CONTRAST TECHNIQUE: Contiguous axial images were obtained from the base of the skull through the vertex without intravenous contrast. RADIATION DOSE REDUCTION: This exam was performed according to the departmental dose-optimization program which includes automated exposure control, adjustment of the mA and/or kV according to patient size and/or use of iterative reconstruction technique. COMPARISON:  None Available. FINDINGS: Brain: No evidence of intracranial  hemorrhage, acute infarction, hydrocephalus, extra-axial collection, or mass lesion/mass effect. Vascular:  No hyperdense vessel or other acute findings. Skull: No evidence of fracture or other significant bone abnormality. Sinuses/Orbits:  No acute findings. Other: None. IMPRESSION: Negative noncontrast head CT. Electronically Signed   By: Norleen DELENA Kil M.D.   On: 11/17/2023 15:59    PROCEDURES:  Critical Care performed: No  Procedures   MEDICATIONS ORDERED IN ED: Medications  ondansetron  (ZOFRAN -ODT) disintegrating tablet 4 mg (4 mg Oral Given 11/17/23 1410)  sodium chloride  0.9 % bolus 1,000 mL (1,000 mLs Intravenous New Bag/Given 11/17/23 1551)  diphenhydrAMINE  (BENADRYL ) injection 25 mg (25 mg Intravenous Given 11/17/23 1551)   prochlorperazine  (COMPAZINE ) injection 10 mg (10 mg Intravenous Given 11/17/23 1551)  ketorolac  (TORADOL ) 15 MG/ML injection 15 mg (15 mg Intravenous Given 11/17/23 1629)     IMPRESSION / MDM / ASSESSMENT AND PLAN / ED COURSE  I reviewed the triage vital signs and the nursing notes.  Differential diagnosis includes, but is not limited to, SAH, other intracranial bleed, benign headache, no fevers, focal deficits, vision changes suggestive of CVA, meningitis, IIH, or cerebral venous sinus thrombosis  Patient's presentation is most consistent with acute presentation with potential threat to life or bodily function.  33 year old female presenting with acute onset headache and vomiting.  No fevers or focal deficits here, but does have persistent headache with multiple episodes of vomiting.  Does seem significantly worse than prior headaches, will obtain CT head to further evaluate for [facility] or other intracranial bleed.  Will treat with IV fluids, Benadryl , Compazine , plan to add NSAIDs if CT without evidence of bleed.  Clinical Course as of 11/17/23 1810  Sat Nov 17, 2023  1709 CT head without acute bleed.  Toradol  added.  Patient assessed.  Report significant improvement in her headache and nausea.  Does not feel she needs further headache medication, agreeable with p.o. trial. [NR]  1808 Patient passed p.o. trial without vomiting.  She is comfortable with discharge home.  Will DC with prescription for Zofran .  Strict return precautions provided.  Patient discharged stable condition. [NR]    Clinical Course User Index [NR] Levander Slate, MD     FINAL CLINICAL IMPRESSION(S) / ED DIAGNOSES   Final diagnoses:  Acute nonintractable headache, unspecified headache type  Nausea and vomiting, unspecified vomiting type     Rx / DC Orders   ED Discharge Orders          Ordered    ondansetron  (ZOFRAN ) 4 MG tablet  Every 6 hours PRN        11/17/23 1810             Note:  This document was  prepared using Dragon voice recognition software and may include unintentional dictation errors.   Levander Slate, MD 11/17/23 [phone]

## 2023-11-17 NOTE — ED Triage Notes (Signed)
 Pt to ED for c/o headache and emesis that started this morning at 1100. Denies diarrhea. Alert and oriented, ambulatory to triage.

## 2023-11-17 NOTE — Discharge Instructions (Signed)
 You were seen in the ER today for evaluation of your headache. Your exam and CT here were fortunately reassuring. Please arrange follow-up with a primary care doctor within the next few days for reevaluation if your symptoms have not improved. Return to the ER if you develop new or worsening headache, fever, neck stiffness, changes in vision, difficulty walking, weakness, dizziness, confusion, vomiting, numbness, tingling, or any other new or concerning symptoms that you believe warrants immediate attention.   I sent a prescription for nausea has not to your pharmacy that you can take as needed.

## 2023-11-18 ENCOUNTER — Other Ambulatory Visit: Payer: Self-pay

## 2023-11-19 ENCOUNTER — Other Ambulatory Visit: Payer: Self-pay

## 2023-11-29 ENCOUNTER — Other Ambulatory Visit: Payer: Self-pay

## 2023-12-05 ENCOUNTER — Ambulatory Visit
Admission: RE | Admit: 2023-12-05 | Discharge: 2023-12-05 | Disposition: A | Discharge status: Home / Self Care | Source: Ambulatory Visit | Attending: Obstetrics and Gynecology | Admitting: Obstetrics and Gynecology

## 2023-12-05 DIAGNOSIS — R92333 Mammographic heterogeneous density, bilateral breasts: Secondary | ICD-10-CM | POA: Diagnosis not present

## 2023-12-05 DIAGNOSIS — Z1231 Encounter for screening mammogram for malignant neoplasm of breast: Secondary | ICD-10-CM | POA: Insufficient documentation

## 2023-12-05 DIAGNOSIS — N6489 Other specified disorders of breast: Secondary | ICD-10-CM | POA: Insufficient documentation

## 2023-12-05 DIAGNOSIS — R921 Mammographic calcification found on diagnostic imaging of breast: Secondary | ICD-10-CM | POA: Diagnosis not present

## 2023-12-05 DIAGNOSIS — R92323 Mammographic fibroglandular density, bilateral breasts: Secondary | ICD-10-CM | POA: Diagnosis not present

## 2023-12-05 DIAGNOSIS — R928 Other abnormal and inconclusive findings on diagnostic imaging of breast: Secondary | ICD-10-CM | POA: Diagnosis not present

## 2023-12-06 ENCOUNTER — Ambulatory Visit: Payer: Self-pay | Admitting: Obstetrics and Gynecology

## 2023-12-12 DIAGNOSIS — M549 Dorsalgia, unspecified: Secondary | ICD-10-CM | POA: Diagnosis not present

## 2023-12-12 DIAGNOSIS — G43009 Migraine without aura, not intractable, without status migrainosus: Secondary | ICD-10-CM | POA: Diagnosis not present

## 2023-12-12 DIAGNOSIS — Z23 Encounter for immunization: Secondary | ICD-10-CM | POA: Diagnosis not present

## 2023-12-12 DIAGNOSIS — Z1331 Encounter for screening for depression: Secondary | ICD-10-CM | POA: Diagnosis not present

## 2023-12-12 DIAGNOSIS — Z0000 Encounter for general adult medical examination without abnormal findings: Secondary | ICD-10-CM | POA: Diagnosis not present

## 2023-12-27 DIAGNOSIS — M6281 Muscle weakness (generalized): Secondary | ICD-10-CM | POA: Diagnosis not present

## 2023-12-27 DIAGNOSIS — G8929 Other chronic pain: Secondary | ICD-10-CM | POA: Diagnosis not present

## 2023-12-27 DIAGNOSIS — M546 Pain in thoracic spine: Secondary | ICD-10-CM | POA: Diagnosis not present

## 2023-12-27 IMAGING — MG MM DIGITAL DIAGNOSTIC UNILAT*L* W/ TOMO W/ CAD
6 series · 6 of 14 positions shown · non-contrast
Comparison: Previous exam(s).

CLINICAL DATA: BI-RADS 3 follow-up of LEFT breast asymmetry and
scattered calcifications. Strong family history of breast cancer
with a paternal aunt having breast cancer at the age of 35. history
of a breast reduction.

EXAM:
DIGITAL DIAGNOSTIC UNILATERAL LEFT MAMMOGRAM WITH TOMOSYNTHESIS AND
CAD
TECHNIQUE: Left digital diagnostic mammography and breast tomosynthesis was
performed. The images were evaluated with computer-aided detection.

[L CC]
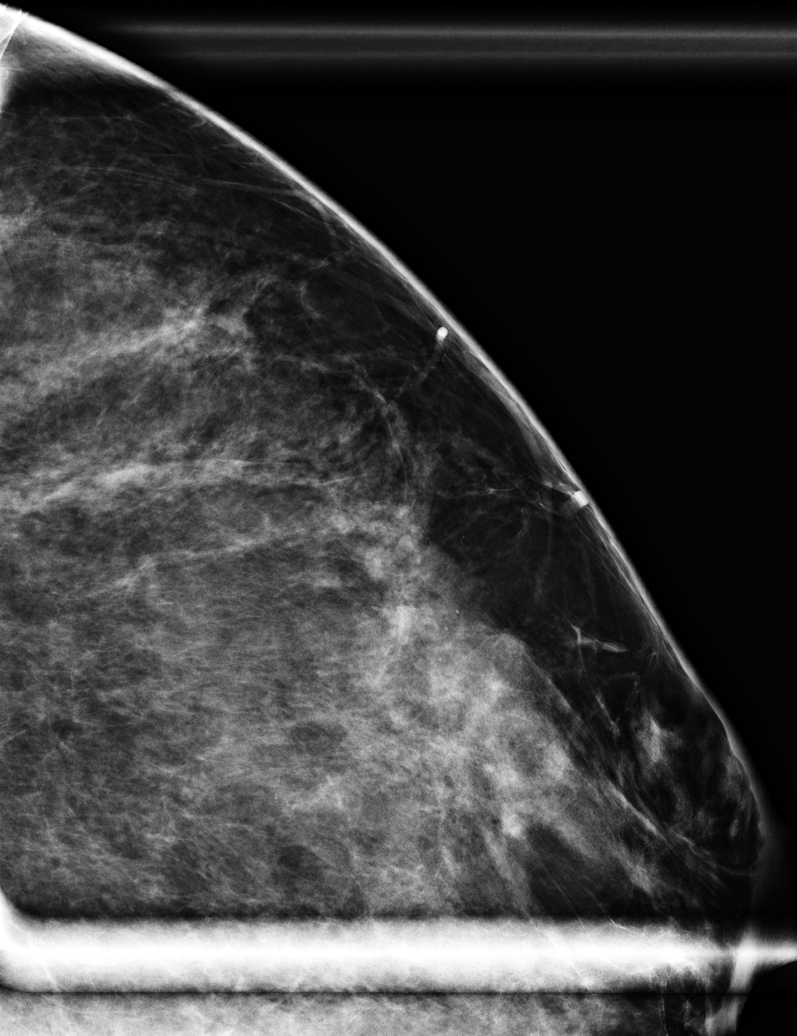

[L ML]
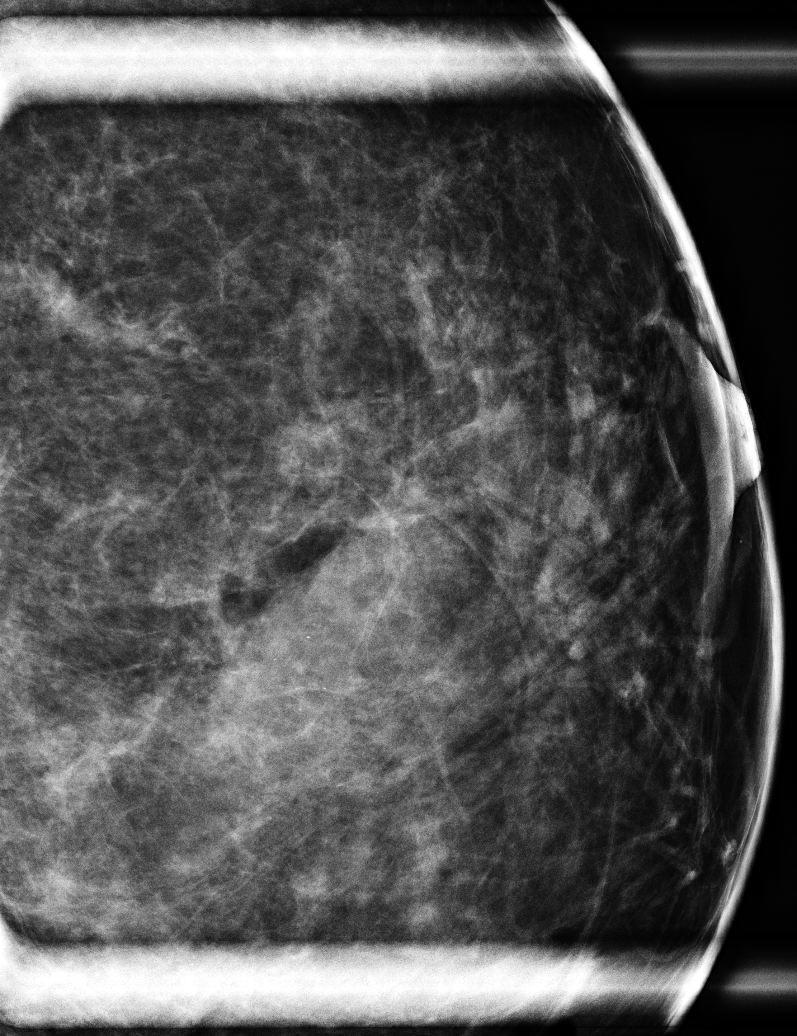

[L CC synth-2D]
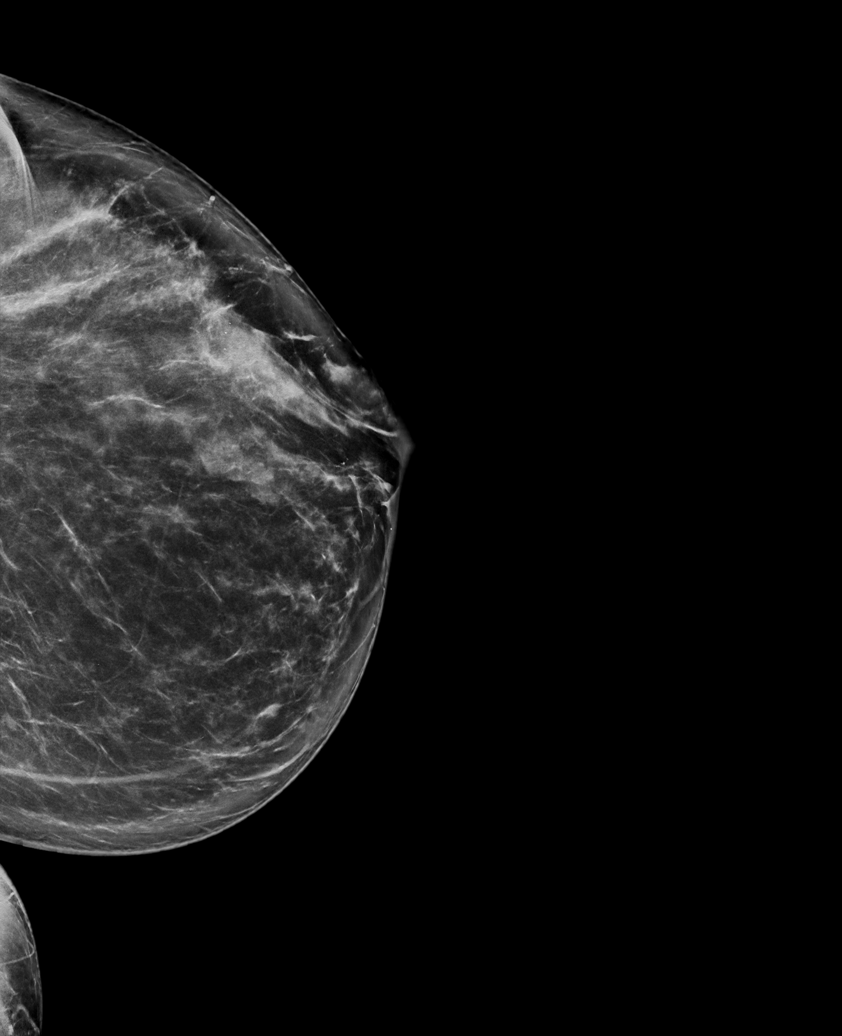

[L MLO synth-2D]
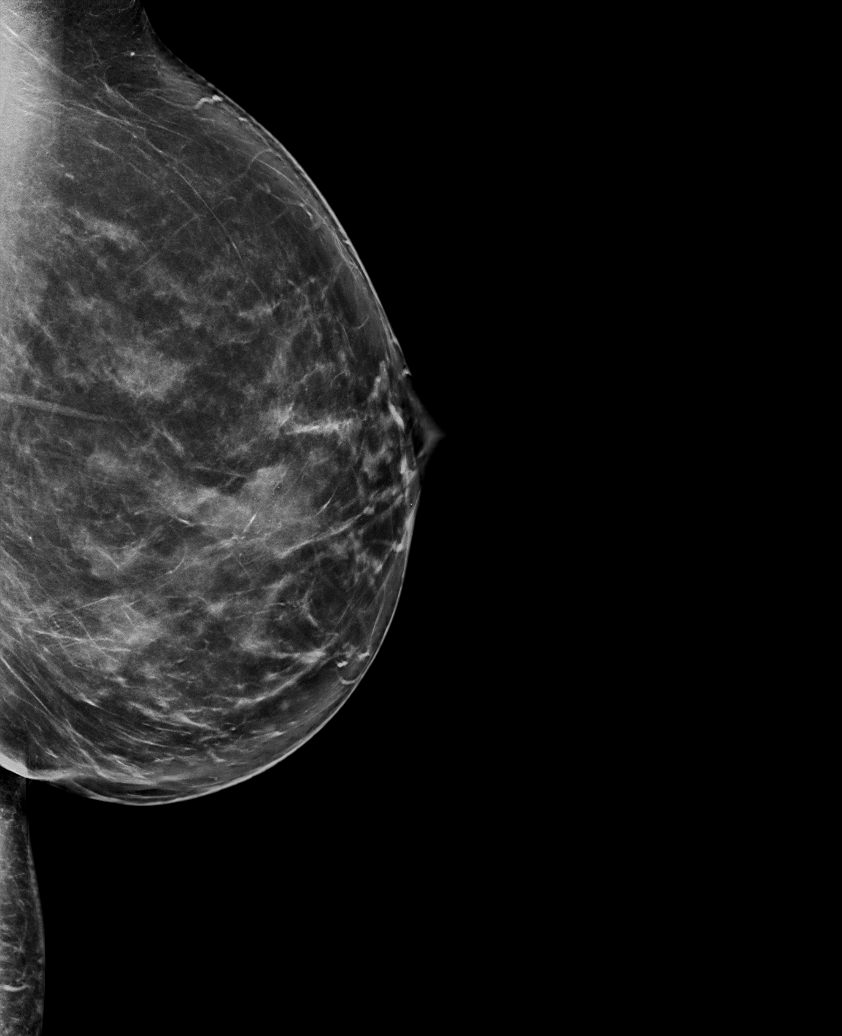

[L MLO tomo · tomo slice 47/94.0]
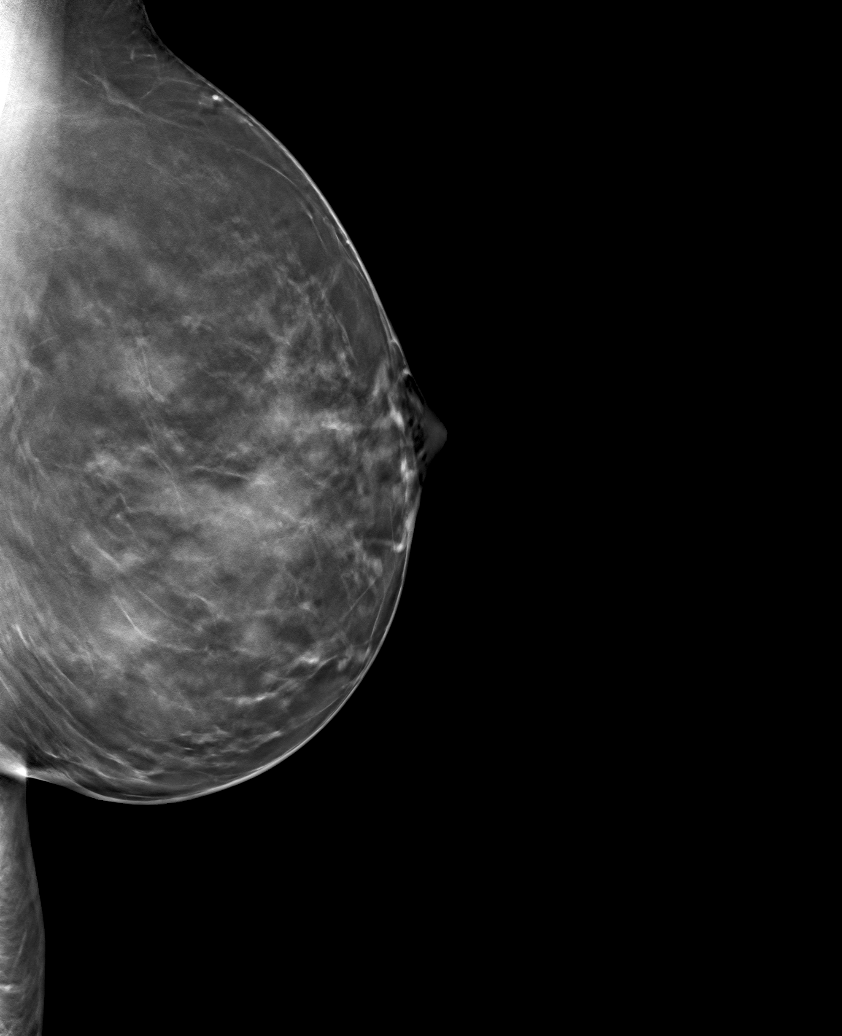

[L CC tomo · tomo slice 45/88.0]
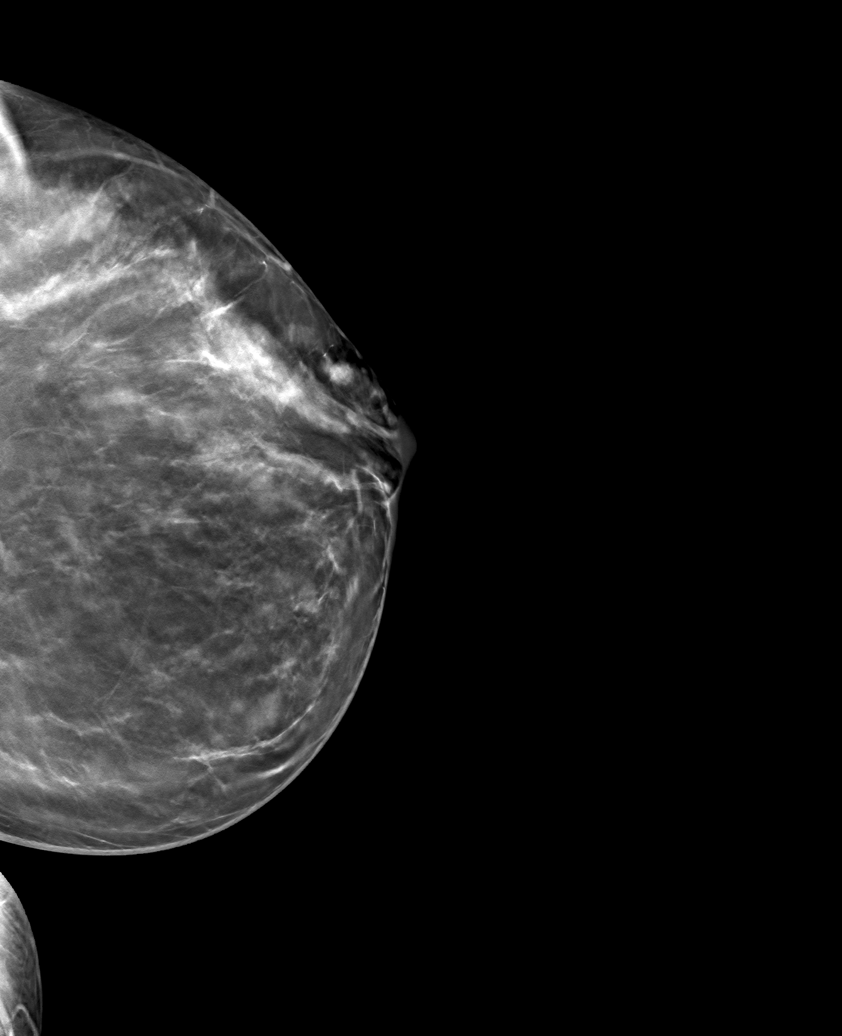

[6 of 14 positions shown; findings below may reference images not displayed]

ACR Breast Density Category c: The breast tissue is heterogeneously
dense, which may obscure small masses.
FINDINGS: Diagnostic images of the LEFT breast demonstrate mammographic
stability of a focal asymmetry in the outer LEFT breast at middle
depth. Spot magnification views demonstrate mammographic stability
of scattered and loosely grouped punctate calcifications in the LEFT
outer breast. No new suspicious findings are noted.
IMPRESSION: 1. Stable probably benign LEFT breast asymmetry. This likely
reflects patient's baseline configuration of postsurgical breast
tissue. Stable scattered and loosely grouped punctate
calcifications. Recommend follow-up diagnostic mammogram (with
ultrasound if deemed necessary) in 6 months. This will establish 1
year of definitive stability.

RECOMMENDATION:
1. Recommend clinical evaluation to establish lifetime risk of
breast cancer given strong family history.
2. Bilateral diagnostic mammogram (with RIGHT and LEFT breast
ultrasound if deemed necessary) in 6 months.
3. If patient is of elevated lifetime risk of breast cancer,
recommend initiation of annual screening MRI.
4. The [employer] recommends annual MRI and mammography
in patients with an estimated lifetime risk of developing breast
cancer greater than 20 - 25%, or who are known or suspected to be
positive for the breast cancer gene.

I have discussed the findings and recommendations with the patient.
If applicable, a reminder letter will be sent to the patient
regarding the next appointment.

BI-RADS CATEGORY  3: Probably benign.

## 2024-01-03 DIAGNOSIS — M546 Pain in thoracic spine: Secondary | ICD-10-CM | POA: Diagnosis not present

## 2024-01-03 DIAGNOSIS — G8929 Other chronic pain: Secondary | ICD-10-CM | POA: Diagnosis not present

## 2024-01-03 DIAGNOSIS — M6281 Muscle weakness (generalized): Secondary | ICD-10-CM | POA: Diagnosis not present

## 2024-01-08 DIAGNOSIS — M546 Pain in thoracic spine: Secondary | ICD-10-CM | POA: Diagnosis not present

## 2024-01-08 DIAGNOSIS — M6281 Muscle weakness (generalized): Secondary | ICD-10-CM | POA: Diagnosis not present

## 2024-01-08 DIAGNOSIS — G8929 Other chronic pain: Secondary | ICD-10-CM | POA: Diagnosis not present

## 2024-01-18 DIAGNOSIS — M546 Pain in thoracic spine: Secondary | ICD-10-CM | POA: Diagnosis not present

## 2024-01-18 DIAGNOSIS — G8929 Other chronic pain: Secondary | ICD-10-CM | POA: Diagnosis not present

## 2024-01-18 DIAGNOSIS — M6281 Muscle weakness (generalized): Secondary | ICD-10-CM | POA: Diagnosis not present

## 2024-01-23 DIAGNOSIS — G8929 Other chronic pain: Secondary | ICD-10-CM | POA: Diagnosis not present

## 2024-01-23 DIAGNOSIS — M6281 Muscle weakness (generalized): Secondary | ICD-10-CM | POA: Diagnosis not present

## 2024-01-23 DIAGNOSIS — M546 Pain in thoracic spine: Secondary | ICD-10-CM | POA: Diagnosis not present

## 2024-01-29 DIAGNOSIS — Z6831 Body mass index (BMI) 31.0-31.9, adult: Secondary | ICD-10-CM | POA: Diagnosis not present

## 2024-01-29 DIAGNOSIS — M546 Pain in thoracic spine: Secondary | ICD-10-CM | POA: Diagnosis not present

## 2024-01-29 DIAGNOSIS — M5030 Other cervical disc degeneration, unspecified cervical region: Secondary | ICD-10-CM | POA: Diagnosis not present

## 2024-01-29 DIAGNOSIS — M542 Cervicalgia: Secondary | ICD-10-CM | POA: Diagnosis not present

## 2024-02-01 ENCOUNTER — Other Ambulatory Visit: Payer: Self-pay

## 2024-02-04 ENCOUNTER — Other Ambulatory Visit: Payer: Self-pay

## 2024-02-04 MED ORDER — PREDNISONE 10 MG PO TABS
ORAL_TABLET | ORAL | 0 refills | Status: DC
  Filled 2024-02-04: qty 30, 10d supply, fill #0

## 2024-02-20 DIAGNOSIS — M542 Cervicalgia: Secondary | ICD-10-CM | POA: Diagnosis not present

## 2024-02-20 DIAGNOSIS — M546 Pain in thoracic spine: Secondary | ICD-10-CM | POA: Diagnosis not present

## 2024-02-20 DIAGNOSIS — M5030 Other cervical disc degeneration, unspecified cervical region: Secondary | ICD-10-CM | POA: Diagnosis not present

## 2024-03-11 ENCOUNTER — Other Ambulatory Visit: Payer: Self-pay

## 2024-03-11 MED ORDER — FLUZONE 0.5 ML IM SUSY
0.5000 mL | PREFILLED_SYRINGE | Freq: Once | INTRAMUSCULAR | 0 refills | Status: AC
  Filled 2024-03-11: qty 0.5, 1d supply, fill #0

## 2024-03-24 DIAGNOSIS — N898 Other specified noninflammatory disorders of vagina: Secondary | ICD-10-CM | POA: Diagnosis not present

## 2024-03-24 DIAGNOSIS — B379 Candidiasis, unspecified: Secondary | ICD-10-CM | POA: Diagnosis not present

## 2024-03-25 ENCOUNTER — Other Ambulatory Visit: Payer: Self-pay

## 2024-03-25 MED ORDER — FLUCONAZOLE 150 MG PO TABS
150.0000 mg | ORAL_TABLET | ORAL | 0 refills | Status: AC
  Filled 2024-03-25: qty 3, 9d supply, fill #0
